# Patient Record
Sex: Male | Born: 1955 | Race: Black or African American | Hispanic: No | Smoking: Never smoker
Health system: Southern US, Community
[De-identification: ages and names within clinical notes are randomized; demographics above are authoritative.]

## PROBLEM LIST (undated history)

## (undated) DIAGNOSIS — E119 Type 2 diabetes mellitus without complications: Secondary | ICD-10-CM

## (undated) DIAGNOSIS — M1990 Unspecified osteoarthritis, unspecified site: Secondary | ICD-10-CM

## (undated) DIAGNOSIS — G4730 Sleep apnea, unspecified: Secondary | ICD-10-CM

## (undated) HISTORY — PX: VASECTOMY: SHX75

---

## 2017-11-30 ENCOUNTER — Ambulatory Visit (INDEPENDENT_AMBULATORY_CARE_PROVIDER_SITE_OTHER): Payer: Self-pay | Admitting: Orthopaedic Surgery

## 2017-12-08 ENCOUNTER — Ambulatory Visit (INDEPENDENT_AMBULATORY_CARE_PROVIDER_SITE_OTHER): Payer: Medicaid Other | Admitting: Orthopaedic Surgery

## 2017-12-08 ENCOUNTER — Other Ambulatory Visit (INDEPENDENT_AMBULATORY_CARE_PROVIDER_SITE_OTHER): Payer: Self-pay

## 2017-12-08 ENCOUNTER — Ambulatory Visit (INDEPENDENT_AMBULATORY_CARE_PROVIDER_SITE_OTHER): Payer: Self-pay

## 2017-12-08 ENCOUNTER — Encounter (INDEPENDENT_AMBULATORY_CARE_PROVIDER_SITE_OTHER): Payer: Self-pay | Admitting: Orthopaedic Surgery

## 2017-12-08 DIAGNOSIS — M2550 Pain in unspecified joint: Secondary | ICD-10-CM

## 2017-12-08 DIAGNOSIS — M1611 Unilateral primary osteoarthritis, right hip: Secondary | ICD-10-CM

## 2017-12-08 DIAGNOSIS — M25551 Pain in right hip: Secondary | ICD-10-CM

## 2017-12-08 NOTE — Addendum Note (Signed)
Addended by: Shonna ChockMCCLINTOCK, Cameran Pettey M on: 12/08/2017 04:08 PM   Modules accepted: Orders

## 2017-12-08 NOTE — Progress Notes (Signed)
Office Visit Note   Patient: Bradley Evans           Date of Birth: 08-28-1956           MRN: [id number] Visit Date: 12/08/2017              Requested by: No referring provider defined for this encounter. PCP: System, Pcp Not In   Assessment & Plan: Visit Diagnoses:  1. Pain in right hip   2. Unilateral primary osteoarthritis, right hip     Plan: We went over his x-rays with him in detail as well as talked about his clinical exam.  We had a long and thorough discussion about hip replacement surgery.  I do feel that this is something we could provide for him however we do need to check some labs today including a CBC, metabolic profile and hemoglobin A1c.  He understands that it is important for good blood glucose control to decrease the risk of infection.  He also understands that his obesity certainly placed in the difficult nature of this procedure and there is at heightened risk of wound complications in the obese population.  He also has a high risk of acute blood loss anemia as well as fracture, infection, DVT, dislocation and wearing out the components over time.  All questions and concerns were asked and addressed.  I gave him a handout about hip replacement surgery and would like to see him back in 2 weeks so we can go over his labs and answer any further questions before considering setting up for the surgery.  Follow-Up Instructions: Return in about 2 weeks (around 12/22/2017).   Orders:  Orders Placed This Encounter  Procedures  . XR HIP UNILAT W OR W/O PELVIS 1V RIGHT   No orders of the defined types were placed in this encounter.     Procedures: No procedures performed   Clinical Data: No additional findings.   Subjective: Chief Complaint  Patient presents with  . Right Hip - Pain  The patient is a very pleasant 62 year old gentleman who comes for further relation and treatment of right hip pain.  His right hip is been worsening over the years in terms of pain.   It hurts in the groin area.  It hurts with rotation activities.  Is been sitting for long period time and gets up to walk he has a lot of pain in the groin.  It does not wake him up at night yet.  He is someone who is significantly obese and is a diabetic.  He is from [facility].  He does go to community clinic down there.  He does not know what his hemoglobin A1c is.  He does take metformin and some other medication but he does not remember the name of today for diabetes.  His pain at this point is starting to detrimentally affect is active daily living, quality of life, mobility.  It can be 10 out of 10 at times.  He does report weighing approximately 270 pounds.  HPI  Review of Systems He denies any headache, chest pain, shortness of breath, fever, chills, nausea, vomiting.  Objective: Vital Signs: There were no vitals taken for this visit.  Physical Exam He is alert and oriented x3 and in no acute distress Ortho Exam Is a very pleasant significantly obese individual who gets up on the exam table easily.  I can easily rotate his left hip throughout full internal and external rotation with no pain in  the groin at all.  His right hip though is quite stiff with internal extra rotation and is very painful at the extremes of rotation.  He does have some slight knee pain but full range of motion of his knees.  Most the pain is around the right hip in the groin area.  He has no trochanteric pain on exam.  His leg lengths are equal. Specialty Comments:  No specialty comments available.  Imaging: Xr Hip Unilat W Or W/o Pelvis 1v Right  Result Date: 12/08/2017 An AP pelvis and a lateral of the right hip shows significant arthritis of the right hip joint.  There is periarticular osteophytes on the superior lateral femoral neck and head as well as posterior.  There is joint space narrowing and sclerotic changes.    PMFS History: There are no active problems to display for this  patient.  History reviewed. No pertinent past medical history.  History reviewed. No pertinent family history.  History reviewed. No pertinent surgical history. Social History   Occupational History  . Not on file  Tobacco Use  . Smoking status: Not on file  Substance and Sexual Activity  . Alcohol use: Not on file  . Drug use: Not on file  . Sexual activity: Not on file

## 2017-12-09 LAB — CBC WITH DIFFERENTIAL/PLATELET
BASOS ABS: 29 {cells}/uL (ref 0–200)
Basophils Relative: 0.8 %
Eosinophils Absolute: 40 cells/uL (ref 15–500)
Eosinophils Relative: 1.1 %
HCT: 39.3 % (ref 38.5–50.0)
HEMOGLOBIN: 13.1 g/dL — AB (ref 13.2–17.1)
Lymphs Abs: 1210 cells/uL (ref 850–3900)
MCH: 28.9 pg (ref 27.0–33.0)
MCHC: 33.3 g/dL (ref 32.0–36.0)
MCV: 86.6 fL (ref 80.0–100.0)
MONOS PCT: 13.4 %
MPV: 10.1 fL (ref 7.5–12.5)
NEUTROS ABS: 1840 {cells}/uL (ref 1500–7800)
NEUTROS PCT: 51.1 %
Platelets: 246 10*3/uL (ref 140–400)
RBC: 4.54 10*6/uL (ref 4.20–5.80)
RDW: 12.6 % (ref 11.0–15.0)
Total Lymphocyte: 33.6 %
WBC mixed population: 482 cells/uL (ref 200–950)
WBC: 3.6 10*3/uL — ABNORMAL LOW (ref 3.8–10.8)

## 2017-12-09 LAB — BASIC METABOLIC PANEL
BUN: 16 mg/dL (ref 7–25)
CALCIUM: 9.1 mg/dL (ref 8.6–10.3)
CO2: 22 mmol/L (ref 20–32)
Chloride: 105 mmol/L (ref 98–110)
Creat: 0.95 mg/dL (ref 0.70–1.25)
GLUCOSE: 269 mg/dL — AB (ref 65–99)
POTASSIUM: 4.2 mmol/L (ref 3.5–5.3)
SODIUM: 139 mmol/L (ref 135–146)

## 2017-12-09 LAB — HEMOGLOBIN A1C
EAG (MMOL/L): 13.3 (calc)
Hgb A1c MFr Bld: 10 % of total Hgb — ABNORMAL HIGH (ref <5.7)
Mean Plasma Glucose: 240 (calc)

## 2017-12-22 ENCOUNTER — Ambulatory Visit (INDEPENDENT_AMBULATORY_CARE_PROVIDER_SITE_OTHER): Payer: Self-pay | Admitting: Orthopaedic Surgery

## 2018-07-18 ENCOUNTER — Encounter (INDEPENDENT_AMBULATORY_CARE_PROVIDER_SITE_OTHER): Payer: Self-pay | Admitting: Orthopaedic Surgery

## 2018-07-18 ENCOUNTER — Ambulatory Visit (INDEPENDENT_AMBULATORY_CARE_PROVIDER_SITE_OTHER): Payer: Self-pay | Admitting: Orthopaedic Surgery

## 2018-07-18 VITALS — Ht 69.0 in | Wt 290.0 lb

## 2018-07-18 DIAGNOSIS — M25551 Pain in right hip: Secondary | ICD-10-CM

## 2018-07-18 DIAGNOSIS — M1611 Unilateral primary osteoarthritis, right hip: Secondary | ICD-10-CM

## 2018-07-18 NOTE — Progress Notes (Signed)
The patient is someone I seen before.  This was in January of this year.  He is 62 year old gentleman with severe arthritis in his right hip.  Unfortunately he had a hemoglobin A1c of 10.  He reported poor blood glucose control.  He also weighed 270 pounds at the time.  He still having significant right hip pain.  He is seen at the [facility] in [place].  He is unsure what his daily blood sugar is.  He is on new blood glucose medications but has not had anything checked in a while.  On exam we have checked his weight and he is gained 20 pounds.  He weighs 290.  Notes a BMI of 43.  He does have severe pain in the right hip with internal and external rotation and does radiate to the knee.  At this point a metal loss of what else I can provide for him until he gets his health under better control.  He understands he needs to have a hemoglobin A1c of 8 or below as well as a BMI of 40.  He would also need to get involved with the [facility] discount program as well.  I gave him information and he will get back to [place] once he knows the results of labs as well as getting his health under better control.  I did give him a one-time prescription for tramadol.

## 2018-12-28 ENCOUNTER — Ambulatory Visit (INDEPENDENT_AMBULATORY_CARE_PROVIDER_SITE_OTHER): Payer: Self-pay | Admitting: Orthopaedic Surgery

## 2018-12-28 DIAGNOSIS — M1611 Unilateral primary osteoarthritis, right hip: Secondary | ICD-10-CM

## 2018-12-28 DIAGNOSIS — M25561 Pain in right knee: Secondary | ICD-10-CM

## 2018-12-28 DIAGNOSIS — G8929 Other chronic pain: Secondary | ICD-10-CM

## 2018-12-28 NOTE — Progress Notes (Signed)
Office Visit Note   Patient: Bradley Evans           Date of Birth: 01-Jun-1956           MRN: [id number] Visit Date: 12/28/2018              Requested by: No referring provider defined for this encounter. PCP: System, Pcp Not In   Assessment & Plan: Visit Diagnoses:  1. Unilateral primary osteoarthritis, right hip   2. Chronic pain of right knee     Plan: He is definitely heading in the right direction and I feel that he will be able to have a successful hip replacement later this year.  He has a follow-up appointment in 3 months with the [facility] and I do feel that we should see him back in 3 months as well.  He will continue to work on his weight loss and blood glucose control.  At his visit with [place] in 3 months hopefully we will be setting him up for a right hip replacement surgery.  At his next visit I would like a standing low AP pelvis and lateral of his right hip as well as an AP and lateral of his right knee.  All question concerns were answered and addressed.  I have asked that he try to use a cane in his opposite hand offload his hip as well.  Follow-Up Instructions: Return in about 3 months (around 03/28/2019).   Orders:  No orders of the defined types were placed in this encounter.  No orders of the defined types were placed in this encounter.     Procedures: No procedures performed   Clinical Data: No additional findings.   Subjective: Chief Complaint  Patient presents with  . Right Hip - Pain  The patient is well-known to me.  He is a 63 year old with debilitating arthritis involving his right hip.  He is also diabetic and has had poor blood glucose control but he is worked hard on getting it under better control.  He is also someone who is morbidly obese and is worked on weight loss.  He is a patient of the [facility].  He says recently he has lost 17 pounds.  When he was weighed yesterday he again for those  back.  When I saw him in the fall of this past year his hemoglobin A1c was 10.  He said yesterday it was down to 8.5.  He still having severe right hip and groin pain and right knee pain.  HPI  Review of Systems He currently denies any headache, chest pain, shortness of breath, fever, chills, nausea, vomiting  Objective: Vital Signs: There were no vitals taken for this visit.  Physical Exam He is alert and orient x3 and in no acute distress Ortho Exam Examination of his right hip does show limitations with internal and external rotation secondary to pain and actually stiffness as well. Specialty Comments:  No specialty comments available.  Imaging: No results found.   PMFS History: Patient Active Problem List   Diagnosis Date Noted  . Unilateral primary osteoarthritis, right hip 07/18/2018  . Pain in right hip 07/18/2018   No past medical history on file.  No family history on file.  No past surgical history on file. Social History   Occupational History  . Not on file  Tobacco Use  . Smoking status: Never Smoker  . Smokeless tobacco: Never Used  Substance and Sexual Activity  .  Alcohol use: Not on file  . Drug use: Not on file  . Sexual activity: Not on file

## 2019-03-28 ENCOUNTER — Ambulatory Visit: Payer: Self-pay | Admitting: Orthopaedic Surgery

## 2020-07-10 ENCOUNTER — Ambulatory Visit: Payer: Medicaid Other | Admitting: Orthopaedic Surgery

## 2020-07-10 ENCOUNTER — Ambulatory Visit (INDEPENDENT_AMBULATORY_CARE_PROVIDER_SITE_OTHER): Payer: Self-pay

## 2020-07-10 ENCOUNTER — Other Ambulatory Visit: Payer: Self-pay

## 2020-07-10 DIAGNOSIS — M25551 Pain in right hip: Secondary | ICD-10-CM

## 2020-07-10 DIAGNOSIS — M1611 Unilateral primary osteoarthritis, right hip: Secondary | ICD-10-CM

## 2020-07-10 NOTE — Progress Notes (Signed)
Office Visit Note   Patient: Bradley Evans           Date of Birth: 09/01/1956           MRN: [id number] Visit Date: 07/10/2020              Requested by: No referring provider defined for this encounter. PCP: System, Pcp Not In   Assessment & Plan: Visit Diagnoses:  1. Pain in right hip   2. Unilateral primary osteoarthritis, right hip     Plan: He does have quite significant arthritis in his right hip and I am recommending a hip replacement.  However, his hemoglobin A1c is high and he would like to still work on weight loss as well as diabetic control and I agree with this.  He says he is a repeat visit in 90 days with his primary care physician to look at his diabetic control.  He is going work on activity modification, weight loss and a good dietary plan.  I gave him a handout about hip replacement surgery went over in detail what the surgery involves.  I would like to see him back in 3 months and at that time have a weight and BMI calculation as well as see if his hemoglobin A1c is at the level that we can safely schedule him for surgery.  All questions and concerns were answered and addressed.  He agrees with this treatment plan.  Follow-Up Instructions: Return in about 3 months (around 10/09/2020).   Orders:  Orders Placed This Encounter  Procedures  . XR HIP UNILAT W OR W/O PELVIS 1V RIGHT   No orders of the defined types were placed in this encounter.     Procedures: No procedures performed   Clinical Data: No additional findings.   Subjective: Chief Complaint  Patient presents with  . Right Hip - Pain  The patient is a very pleasant 64 year old gentleman who comes in for evaluation treatment of right hip pain and for me to consider him for knee replacement surgery.  He has pain in his right hip and groin that is radiating to his knee.  This is been going on for several years now and has been slowly getting worse.  At this point his right hip pain is daily and is  detrimentally affecting his mobility, his quality of life and his activities day living.  He is someone who is obese but has lost 20 pounds over the last several months.  He is also diabetic and reports his hemoglobin A1c just recently was 7.8.  He does have some left hip and groin pain as well.  He walks without an assistive device.  He said he is interested in still working on diabetic control and weight loss. HPI  Review of Systems He currently denies any headache, chest pain, shortness of breath, fever, chills, nausea, vomiting  Objective: Vital Signs: There were no vitals taken for this visit.  Physical Exam He is alert and orient x3 and in no acute distress; he does walk with a limp. Ortho Exam Examination of both hips show significant stiffness with both hips with the right more stiff than the left.  Both hips have pain with internal and external rotation with pain in the groin.  The right hip is much worse than the left.  His leg lengths are equal and his knee exam bilaterally is normal. Specialty Comments:  No specialty comments available.  Imaging: XR HIP UNILAT W OR W/O PELVIS 1V RIGHT  Result Date: 07/10/2020 An AP pelvis lateral the right hip shows significant arthritis of the right hip.  There is some flattening of the superior lateral femoral head as well as large periarticular osteophytes.    PMFS History: Patient Active Problem List   Diagnosis Date Noted  . Unilateral primary osteoarthritis, right hip 07/18/2018  . Pain in right hip 07/18/2018   No past medical history on file.  No family history on file.  No past surgical history on file. Social History   Occupational History  . Not on file  Tobacco Use  . Smoking status: Never Smoker  . Smokeless tobacco: Never Used  Substance and Sexual Activity  . Alcohol use: Not on file  . Drug use: Not on file  . Sexual activity: Not on file

## 2020-10-09 ENCOUNTER — Encounter: Payer: Self-pay | Admitting: Orthopaedic Surgery

## 2020-10-09 ENCOUNTER — Ambulatory Visit (INDEPENDENT_AMBULATORY_CARE_PROVIDER_SITE_OTHER): Payer: Self-pay | Admitting: Orthopaedic Surgery

## 2020-10-09 VITALS — Ht 69.0 in | Wt 259.0 lb

## 2020-10-09 DIAGNOSIS — M1611 Unilateral primary osteoarthritis, right hip: Secondary | ICD-10-CM

## 2020-10-09 DIAGNOSIS — M25551 Pain in right hip: Secondary | ICD-10-CM

## 2020-10-09 NOTE — Progress Notes (Signed)
The patient is well-known to me.  I been seeing him since early 2019 for his right hip.  He has severe arthritis in that right hip.  He has worked hard over the last few years to lose weight.  His BMI is now down to 38.  He is also diabetic and is now under good blood glucose control with his last hemoglobin A1c a few weeks ago of 6.5.  At this point his right hip pain is definitely affecting his mobility, his quality of life and his actives daily living.  He has well-documented severe arthritis in that right hip.  He has had no other acute change in medical status.  He is interested in proceeding with hip replacement surgery at this point.  We have worked on all the conservative nonoperative treatment measures for well over 2 years now including hip strengthening exercises as well as taking anti-inflammatories and intra-articular injections.  We held off on injections for a long time because of his blood glucose pain poorly controlled.  Now with it being under excellent control and with him continue to lose weight, he is the appropriate candidate this point for hip replacement.  I have already shown him a hip model and explained in detail what the surgery involves.  We have had a long thorough discussion about the risk and benefits of surgery and what to expect with his interoperative and postoperative course.  Examination of his left hip shows it moves smoothly and fluidly.  Examination of his right painful hip has significant stiffness with internal and external rotation and significant pain in the groin.  Previous x-rays were reviewed and it does show severe arthritis of the right hip with joint space narrowing in particular osteophytes.  At this point we will work on getting him scheduled for hip replacement surgery sometime after November 27, 2020.  All questions concerns were answered and addressed.  We will be in touch.

## 2020-12-02 ENCOUNTER — Other Ambulatory Visit: Payer: Self-pay | Admitting: Physician Assistant

## 2020-12-05 NOTE — Progress Notes (Signed)
No Pharmacies Listed     Your procedure is scheduled on February 1  Report to [facility] Main Entrance "A" at 1000 A.M., and check in at the Admitting office.  Call this number if you have problems the morning of surgery:  [phone]  Call [phone] if you have any questions prior to your surgery date Monday-Friday 8am-4pm    Remember:  Do not eat after midnight the night before your surgery  You may drink clear liquids until 0900 am the morning of your surgery.   Clear liquids allowed are: Water, Non-Citrus Juices (without pulp), Carbonated Beverages, Clear Tea, Black Coffee Only, and Gatorade   Enhanced Recovery after Surgery for Orthopedics Enhanced Recovery after Surgery is a protocol used to improve the stress on your body and your recovery after surgery.  Patient Instructions  . The night before surgery:  o No food after midnight. ONLY clear liquids after midnight  . The day of surgery (if you have diabetes):  o Drink ONE (1) small bottle of water by _0900 am____ the morning of surgery o This drink was given to you during your hospital  pre-op appointment visit.  o Nothing else to drink after completing the  Small bottle of water.         If you have questions, please contact your surgeon's office.     Take these medicines the morning of surgery with A SIP OF WATER  atorvastatin (LIPITOR)    As of today, STOP taking any Aspirin (unless otherwise instructed by your surgeon) Aleve, Naproxen, Ibuprofen, Motrin, Advil, Goody's, BC's, all herbal medications, fish oil, and all vitamins.   WHAT DO I DO ABOUT MY DIABETES MEDICATION?  Do not take dapagliflozin propanediol (FARXIGA the day before surgery   . Do not take oral diabetes medicines (pills) the morning of surgery. metFORMIN (GLUCOPHAGE)  And dapagliflozin propanediol (FARXIGA   HOW TO MANAGE YOUR DIABETES BEFORE AND AFTER SURGERY  Why is it important to control my blood sugar before and after  surgery? . Improving blood sugar levels before and after surgery helps healing and can limit problems. . A way of improving blood sugar control is eating a healthy diet by: o  Eating less sugar and carbohydrates o  Increasing activity/exercise o  Talking with your doctor about reaching your blood sugar goals . High blood sugars (greater than 180 mg/dL) can raise your risk of infections and slow your recovery, so you will need to focus on controlling your diabetes during the weeks before surgery. . Make sure that the doctor who takes care of your diabetes knows about your planned surgery including the date and location.  How do I manage my blood sugar before surgery? . Check your blood sugar at least 4 times a day, starting 2 days before surgery, to make sure that the level is not too high or low. . Check your blood sugar the morning of your surgery when you wake up and every 2 hours until you get to the Short Stay unit. o If your blood sugar is less than 70 mg/dL, you will need to treat for low blood sugar: - Do not take insulin. - Treat a low blood sugar (less than 70 mg/dL) with  cup of clear juice (cranberry or apple), 4 glucose tablets, OR glucose gel. - Recheck blood sugar in 15 minutes after treatment (to make sure it is greater than 70 mg/dL). If your blood sugar is not greater than 70 mg/dL on recheck, call [phone] for  further instructions. . Report your blood sugar to the short stay nurse when you get to Short Stay.  . If you are admitted to the hospital after surgery: o Your blood sugar will be checked by the staff and you will probably be given insulin after surgery (instead of oral diabetes medicines) to make sure you have good blood sugar levels. o The goal for blood sugar control after surgery is 80-180 mg/dL.                      Do not wear jewelry            Do not wear lotions, powders, colognes, or deodorant.            Men may shave face and neck.            Do not  bring valuables to the hospital.            [facility] is not responsible for any belongings or valuables.  Do NOT Smoke (Tobacco/Vaping) or drink Alcohol 24 hours prior to your procedure If you use a CPAP at night, you may bring all equipment for your overnight stay.   Contacts, glasses, dentures or bridgework may not be worn into surgery.      For patients admitted to the hospital, discharge time will be determined by your treatment team.   Patients discharged the day of surgery will not be allowed to drive home, and someone needs to stay with them for 24 hours.    Special instructions:   - Preparing For Surgery  Before surgery, you can play an important role. Because skin is not sterile, your skin needs to be as free of germs as possible. You can reduce the number of germs on your skin by washing with CHG (chlorahexidine gluconate) Soap before surgery.  CHG is an antiseptic cleaner which kills germs and bonds with the skin to continue killing germs even after washing.    Oral Hygiene is also important to reduce your risk of infection.  Remember - BRUSH YOUR TEETH THE MORNING OF SURGERY WITH YOUR REGULAR TOOTHPASTE  Please do not use if you have an allergy to CHG or antibacterial soaps. If your skin becomes reddened/irritated stop using the CHG.  Do not shave (including legs and underarms) for at least 48 hours prior to first CHG shower. It is OK to shave your face.  Please follow these instructions carefully.   1. Shower the NIGHT BEFORE SURGERY and the MORNING OF SURGERY with CHG Soap.   2. If you chose to wash your hair, wash your hair first as usual with your normal shampoo.  3. After you shampoo, rinse your hair and body thoroughly to remove the shampoo.  4. Use CHG as you would any other liquid soap. You can apply CHG directly to the skin and wash gently with a scrungie or a clean washcloth.   5. Apply the CHG Soap to your body ONLY FROM THE NECK DOWN.  Do not  use on open wounds or open sores. Avoid contact with your eyes, ears, mouth and genitals (private parts). Wash Face and genitals (private parts)  with your normal soap.   6. Wash thoroughly, paying special attention to the area where your surgery will be performed.  7. Thoroughly rinse your body with warm water from the neck down.  8. DO NOT shower/wash with your normal soap after using and rinsing off the CHG Soap.  9.  Pat yourself dry with a CLEAN TOWEL.  10. Wear CLEAN PAJAMAS to bed the night before surgery  11. Place CLEAN SHEETS on your bed the night of your first shower and DO NOT SLEEP WITH PETS.   Day of Surgery: Wear Clean/Comfortable clothing the morning of surgery Do not apply any deodorants/lotions.   Remember to brush your teeth WITH YOUR REGULAR TOOTHPASTE.   Please read over the following fact sheets that you were given.

## 2020-12-06 ENCOUNTER — Encounter (HOSPITAL_COMMUNITY)
Admission: RE | Admit: 2020-12-06 | Discharge: 2020-12-06 | Disposition: A | Discharge status: Home / Self Care | Payer: Medicaid Other | Source: Ambulatory Visit | Attending: Orthopaedic Surgery | Admitting: Orthopaedic Surgery

## 2020-12-06 ENCOUNTER — Other Ambulatory Visit: Payer: Self-pay

## 2020-12-06 ENCOUNTER — Telehealth: Payer: Self-pay | Admitting: Orthopaedic Surgery

## 2020-12-06 ENCOUNTER — Other Ambulatory Visit: Payer: Self-pay | Admitting: Orthopaedic Surgery

## 2020-12-06 ENCOUNTER — Other Ambulatory Visit (HOSPITAL_COMMUNITY)
Admission: RE | Admit: 2020-12-06 | Discharge: 2020-12-06 | Disposition: A | Discharge status: Home / Self Care | Payer: Medicaid Other | Source: Ambulatory Visit | Attending: Orthopaedic Surgery | Admitting: Orthopaedic Surgery

## 2020-12-06 ENCOUNTER — Encounter (HOSPITAL_COMMUNITY): Payer: Self-pay

## 2020-12-06 DIAGNOSIS — I4519 Other right bundle-branch block: Secondary | ICD-10-CM | POA: Insufficient documentation

## 2020-12-06 DIAGNOSIS — Z01818 Encounter for other preprocedural examination: Secondary | ICD-10-CM | POA: Insufficient documentation

## 2020-12-06 DIAGNOSIS — Z20822 Contact with and (suspected) exposure to covid-19: Secondary | ICD-10-CM | POA: Insufficient documentation

## 2020-12-06 DIAGNOSIS — Z01812 Encounter for preprocedural laboratory examination: Secondary | ICD-10-CM | POA: Insufficient documentation

## 2020-12-06 HISTORY — DX: Type 2 diabetes mellitus without complications: E11.9

## 2020-12-06 HISTORY — DX: Sleep apnea, unspecified: G47.30

## 2020-12-06 HISTORY — DX: Unspecified osteoarthritis, unspecified site: M19.90

## 2020-12-06 LAB — CBC
HCT: 45.1 % (ref 39.0–52.0)
Hemoglobin: 14.8 g/dL (ref 13.0–17.0)
MCH: 29.1 pg (ref 26.0–34.0)
MCHC: 32.8 g/dL (ref 30.0–36.0)
MCV: 88.6 fL (ref 80.0–100.0)
Platelets: 276 10*3/uL (ref 150–400)
RBC: 5.09 MIL/uL (ref 4.22–5.81)
RDW: 13.9 % (ref 11.5–15.5)
WBC: 3.9 10*3/uL — ABNORMAL LOW (ref 4.0–10.5)
nRBC: 0 % (ref 0.0–0.2)

## 2020-12-06 LAB — BASIC METABOLIC PANEL
Anion gap: 8 (ref 5–15)
BUN: 13 mg/dL (ref 8–23)
CO2: 25 mmol/L (ref 22–32)
Calcium: 9.2 mg/dL (ref 8.9–10.3)
Chloride: 107 mmol/L (ref 98–111)
Creatinine, Ser: 1.06 mg/dL (ref 0.61–1.24)
GFR, Estimated: >60 mL/min (ref >60)
Glucose, Bld: 108 mg/dL — ABNORMAL HIGH (ref 70–99)
Potassium: 4.3 mmol/L (ref 3.5–5.1)
Sodium: 140 mmol/L (ref 135–145)

## 2020-12-06 LAB — SURGICAL PCR SCREEN
MRSA, PCR: NEGATIVE
Staphylococcus aureus: NEGATIVE

## 2020-12-06 LAB — TYPE AND SCREEN
ABO/RH(D): O POS
Antibody Screen: NEGATIVE

## 2020-12-06 LAB — HEMOGLOBIN A1C
Hgb A1c MFr Bld: 6.7 % — ABNORMAL HIGH (ref 4.8–5.6)
Mean Plasma Glucose: 145.59 mg/dL

## 2020-12-06 LAB — GLUCOSE, CAPILLARY: Glucose-Capillary: 111 mg/dL — ABNORMAL HIGH (ref 70–99)

## 2020-12-06 NOTE — Telephone Encounter (Signed)
Tell him yes, we will.

## 2020-12-06 NOTE — Telephone Encounter (Signed)
This is his pharmacy. Per Leotis Shames he will need to take a hand written Rx to them. Unable to add it to the system

## 2020-12-06 NOTE — Telephone Encounter (Signed)
Pt advised. He wanted me to let you know the clinic he goes to closes @ 4pm

## 2020-12-06 NOTE — Telephone Encounter (Signed)
[facility] in [place] Address: [address] [place], [place] [address] Areas served: [facility] and nearby areas Hours:  Closed ? Oneita Jolly Phone: [phone]

## 2020-12-06 NOTE — Telephone Encounter (Signed)
Pt called wondering if you can prescribe him the pain medication the day before surgery for after the surgery because he gets it from a clinic and they'll be closed after he has surgery.

## 2020-12-06 NOTE — Progress Notes (Addendum)
PCP - [facility]y wil[facility] In [place] Cardiologist - na   Chest x-ray - na EKG - 12/06/20 Stress Test - na ECHO - na Cardiac Cath - na  Sleep Study - yes, study done > 5 yrs. ago CPAP - yes  Fasting Blood Sugar - 120-140 Checks Blood Sugar ___1__ times a week  Blood Thinner Instructions: na Aspirin Instructions: stop today  ERAS Protcol -yes Bottle water given to pt.  COVID TEST- 12/06/20   Anesthesia review:   Patient denies shortness of breath, fever, cough and chest pain at PAT appointment   All instructions explained to the patient, with a verbal understanding of the material. Patient agrees to go over the instructions while at home for a better understanding. Patient also instructed to self quarantine after being tested for COVID-19. The opportunity to ask questions was provided.

## 2020-12-06 NOTE — Telephone Encounter (Signed)
Do find out where he needs the medications sent to - which pharmacy is it?  I don't understand what he means about a clinic to send meds to

## 2020-12-07 LAB — SARS CORONAVIRUS 2 (TAT 6-24 HRS): SARS Coronavirus 2: NEGATIVE

## 2020-12-09 ENCOUNTER — Telehealth: Payer: Self-pay | Admitting: Orthopaedic Surgery

## 2020-12-09 MED ORDER — ASPIRIN 81 MG PO CHEW: 81.0000 mg | CHEWABLE_TABLET | Freq: Two times a day (BID) | ORAL | 0 refills | Status: AC

## 2020-12-09 MED ORDER — TIZANIDINE HCL 4 MG PO TABS: 4.0000 mg | ORAL_TABLET | Freq: Three times a day (TID) | ORAL | 1 refills | Status: AC | PRN

## 2020-12-09 MED ORDER — OXYCODONE HCL 5 MG PO TABS: 5.0000 mg | ORAL_TABLET | ORAL | 0 refills | Status: AC | PRN

## 2020-12-09 NOTE — Telephone Encounter (Signed)
His pharmacy that we sent his pain med too does not fill narcotics; he is asking if we can fill at [facility] [address] [place]

## 2020-12-09 NOTE — Telephone Encounter (Signed)
Pt called stating the pharmacy he uses doesn't fill controlled substances and he is scheduled for surgery on 12/10/20 so he would like a CB in regards to discuss how much the medicine may cost elsewhere and his other options as soon as possible.  [phone]

## 2020-12-10 ENCOUNTER — Ambulatory Visit (HOSPITAL_COMMUNITY): Payer: Self-pay | Admitting: Physician Assistant

## 2020-12-10 ENCOUNTER — Encounter (HOSPITAL_COMMUNITY): Payer: Self-pay | Admitting: Orthopaedic Surgery

## 2020-12-10 ENCOUNTER — Other Ambulatory Visit: Payer: Self-pay

## 2020-12-10 ENCOUNTER — Ambulatory Visit (HOSPITAL_COMMUNITY): Payer: Self-pay

## 2020-12-10 ENCOUNTER — Encounter (HOSPITAL_COMMUNITY)
Admission: RE | Disposition: A | Discharge status: Home / Self Care | Payer: Self-pay | Source: Home / Self Care | Attending: Orthopaedic Surgery

## 2020-12-10 ENCOUNTER — Inpatient Hospital Stay (HOSPITAL_COMMUNITY)
Admission: RE | Admit: 2020-12-10 | Discharge: 2020-12-13 | DRG: 470 | Disposition: A | Discharge status: Home / Self Care | Payer: Self-pay | Attending: Orthopaedic Surgery | Admitting: Orthopaedic Surgery

## 2020-12-10 ENCOUNTER — Ambulatory Visit (HOSPITAL_COMMUNITY): Payer: Self-pay | Admitting: Certified Registered Nurse Anesthetist

## 2020-12-10 DIAGNOSIS — E1165 Type 2 diabetes mellitus with hyperglycemia: Secondary | ICD-10-CM | POA: Diagnosis not present

## 2020-12-10 DIAGNOSIS — M1611 Unilateral primary osteoarthritis, right hip: Principal | ICD-10-CM

## 2020-12-10 DIAGNOSIS — M25751 Osteophyte, right hip: Secondary | ICD-10-CM | POA: Diagnosis present

## 2020-12-10 DIAGNOSIS — Z419 Encounter for procedure for purposes other than remedying health state, unspecified: Secondary | ICD-10-CM

## 2020-12-10 DIAGNOSIS — Z96641 Presence of right artificial hip joint: Secondary | ICD-10-CM | POA: Diagnosis present

## 2020-12-10 DIAGNOSIS — G4730 Sleep apnea, unspecified: Secondary | ICD-10-CM | POA: Diagnosis present

## 2020-12-10 HISTORY — PX: TOTAL HIP ARTHROPLASTY: SHX124

## 2020-12-10 LAB — GLUCOSE, CAPILLARY
Glucose-Capillary: 121 mg/dL — ABNORMAL HIGH (ref 70–99)
Glucose-Capillary: 90 mg/dL (ref 70–99)
Glucose-Capillary: 79 mg/dL (ref 70–99)
Glucose-Capillary: 136 mg/dL — ABNORMAL HIGH (ref 70–99)

## 2020-12-10 LAB — ABO/RH: ABO/RH(D): O POS

## 2020-12-10 SURGERY — ARTHROPLASTY, HIP, TOTAL, ANTERIOR APPROACH
Anesthesia: Monitor Anesthesia Care | Site: Hip | Laterality: Right

## 2020-12-10 MED ORDER — ONDANSETRON HCL 4 MG/2ML IJ SOLN: 4.0000 mg | Freq: Four times a day (QID) | INTRAMUSCULAR | Status: DC | PRN

## 2020-12-10 MED ORDER — METOCLOPRAMIDE HCL 5 MG/ML IJ SOLN
5.0000 mg | Freq: Three times a day (TID) | INTRAMUSCULAR | Status: DC | PRN
Start: 2020-12-10 — End: 2020-12-13

## 2020-12-10 MED ORDER — FENTANYL CITRATE (PF) 250 MCG/5ML IJ SOLN
INTRAMUSCULAR | Status: AC
  Filled 2020-12-10: qty 5

## 2020-12-10 MED ORDER — HYDROMORPHONE HCL 1 MG/ML IJ SOLN
0.5000 mg | INTRAMUSCULAR | Status: DC | PRN
  Administered 2020-12-10 – 2020-12-11 (×2): 1 mg via INTRAVENOUS
  Filled 2020-12-10 (×2): qty 1

## 2020-12-10 MED ORDER — LISINOPRIL 10 MG PO TABS
10.0000 mg | ORAL_TABLET | Freq: Every day | ORAL | Status: DC
Start: 2020-12-10 — End: 2020-12-13
  Administered 2020-12-10 – 2020-12-11 (×2): 10 mg via ORAL
  Filled 2020-12-10 (×2): qty 1

## 2020-12-10 MED ORDER — LACTATED RINGERS IV BOLUS: 500.0000 mL | Freq: Once | INTRAVENOUS | Status: DC

## 2020-12-10 MED ORDER — ALUM & MAG HYDROXIDE-SIMETH 200-200-20 MG/5ML PO SUSP: 30.0000 mL | ORAL | Status: DC | PRN

## 2020-12-10 MED ORDER — 0.9 % SODIUM CHLORIDE (POUR BTL) OPTIME
TOPICAL | Status: DC | PRN
  Administered 2020-12-10: 1000 mL

## 2020-12-10 MED ORDER — SODIUM CHLORIDE 0.9 % IV SOLN: INTRAVENOUS | Status: DC

## 2020-12-10 MED ORDER — BUPIVACAINE IN DEXTROSE 0.75-8.25 % IT SOLN
INTRATHECAL | Status: DC | PRN
  Administered 2020-12-10: 1.6 mL via INTRATHECAL

## 2020-12-10 MED ORDER — MENTHOL 3 MG MT LOZG
1.0000 | LOZENGE | OROMUCOSAL | Status: DC | PRN
Start: 2020-12-10 — End: 2020-12-13

## 2020-12-10 MED ORDER — DIPHENHYDRAMINE HCL 12.5 MG/5ML PO ELIX
12.5000 mg | ORAL_SOLUTION | ORAL | Status: DC | PRN
Start: 2020-12-10 — End: 2020-12-13
  Filled 2020-12-10: qty 10

## 2020-12-10 MED ORDER — PANTOPRAZOLE SODIUM 40 MG PO TBEC
40.0000 mg | DELAYED_RELEASE_TABLET | Freq: Every day | ORAL | Status: DC
  Administered 2020-12-10 – 2020-12-13 (×4): 40 mg via ORAL
  Filled 2020-12-10 (×4): qty 1

## 2020-12-10 MED ORDER — METOCLOPRAMIDE HCL 5 MG PO TABS: 5.0000 mg | ORAL_TABLET | Freq: Three times a day (TID) | ORAL | Status: DC | PRN

## 2020-12-10 MED ORDER — POVIDONE-IODINE 10 % EX SWAB
2.0000 "application " | Freq: Once | CUTANEOUS | Status: AC
  Administered 2020-12-10: 2 via TOPICAL

## 2020-12-10 MED ORDER — ONDANSETRON HCL 4 MG/2ML IJ SOLN
INTRAMUSCULAR | Status: DC | PRN
  Administered 2020-12-10: 4 mg via INTRAVENOUS

## 2020-12-10 MED ORDER — FENTANYL CITRATE (PF) 250 MCG/5ML IJ SOLN
INTRAMUSCULAR | Status: DC | PRN
  Administered 2020-12-10: 25 ug via INTRAVENOUS
  Administered 2020-12-10: 100 ug via INTRAVENOUS

## 2020-12-10 MED ORDER — LACTATED RINGERS IV BOLUS: 250.0000 mL | Freq: Once | INTRAVENOUS | Status: DC

## 2020-12-10 MED ORDER — DOCUSATE SODIUM 100 MG PO CAPS
100.0000 mg | ORAL_CAPSULE | Freq: Two times a day (BID) | ORAL | Status: DC
  Administered 2020-12-10 – 2020-12-13 (×6): 100 mg via ORAL
  Filled 2020-12-10 (×6): qty 1

## 2020-12-10 MED ORDER — BUPIVACAINE HCL (PF) 0.25 % IJ SOLN
INTRAMUSCULAR | Status: DC | PRN
  Administered 2020-12-10: 30 mL

## 2020-12-10 MED ORDER — ASPIRIN 81 MG PO CHEW
81.0000 mg | CHEWABLE_TABLET | Freq: Two times a day (BID) | ORAL | Status: DC
  Administered 2020-12-10 – 2020-12-13 (×6): 81 mg via ORAL
  Filled 2020-12-10 (×6): qty 1

## 2020-12-10 MED ORDER — BUPIVACAINE HCL (PF) 0.25 % IJ SOLN
INTRAMUSCULAR | Status: AC
  Filled 2020-12-10: qty 30

## 2020-12-10 MED ORDER — METHOCARBAMOL 1000 MG/10ML IJ SOLN
500.0000 mg | Freq: Four times a day (QID) | INTRAVENOUS | Status: DC | PRN
  Filled 2020-12-10: qty 5

## 2020-12-10 MED ORDER — OXYCODONE HCL 5 MG PO TABS
10.0000 mg | ORAL_TABLET | ORAL | Status: DC | PRN
  Administered 2020-12-11 (×2): 15 mg via ORAL
  Filled 2020-12-10 (×2): qty 3

## 2020-12-10 MED ORDER — OXYCODONE HCL 5 MG PO TABS
5.0000 mg | ORAL_TABLET | ORAL | Status: DC | PRN
  Administered 2020-12-10 – 2020-12-11 (×4): 10 mg via ORAL
  Administered 2020-12-12 (×2): 5 mg via ORAL
  Administered 2020-12-12: 10 mg via ORAL
  Administered 2020-12-12 – 2020-12-13 (×4): 5 mg via ORAL
  Filled 2020-12-10 (×2): qty 1
  Filled 2020-12-10 (×4): qty 2
  Filled 2020-12-10: qty 1
  Filled 2020-12-10 (×2): qty 2
  Filled 2020-12-10 (×2): qty 1

## 2020-12-10 MED ORDER — ONDANSETRON HCL 4 MG/2ML IJ SOLN: 4.0000 mg | Freq: Once | INTRAMUSCULAR | Status: DC | PRN

## 2020-12-10 MED ORDER — DAPAGLIFLOZIN PROPANEDIOL 10 MG PO TABS
10.0000 mg | ORAL_TABLET | Freq: Every day | ORAL | Status: DC
  Administered 2020-12-11 – 2020-12-13 (×3): 10 mg via ORAL
  Filled 2020-12-10 (×3): qty 1

## 2020-12-10 MED ORDER — TRANEXAMIC ACID-NACL 1000-0.7 MG/100ML-% IV SOLN
1000.0000 mg | INTRAVENOUS | Status: AC
  Administered 2020-12-10: 1000 mg via INTRAVENOUS
  Filled 2020-12-10: qty 100

## 2020-12-10 MED ORDER — METFORMIN HCL 500 MG PO TABS
1000.0000 mg | ORAL_TABLET | Freq: Two times a day (BID) | ORAL | Status: DC
  Administered 2020-12-11 – 2020-12-13 (×5): 1000 mg via ORAL
  Filled 2020-12-10 (×5): qty 2

## 2020-12-10 MED ORDER — PHENOL 1.4 % MT LIQD: 1.0000 | OROMUCOSAL | Status: DC | PRN

## 2020-12-10 MED ORDER — PROPOFOL 500 MG/50ML IV EMUL
INTRAVENOUS | Status: DC | PRN
  Administered 2020-12-10: 100 ug/kg/min via INTRAVENOUS

## 2020-12-10 MED ORDER — CEFAZOLIN SODIUM-DEXTROSE 2-4 GM/100ML-% IV SOLN
2.0000 g | INTRAVENOUS | Status: AC
  Administered 2020-12-10: 2 g via INTRAVENOUS
  Filled 2020-12-10: qty 100

## 2020-12-10 MED ORDER — ACETAMINOPHEN 325 MG PO TABS
325.0000 mg | ORAL_TABLET | Freq: Four times a day (QID) | ORAL | Status: DC | PRN
  Administered 2020-12-11 – 2020-12-13 (×4): 650 mg via ORAL
  Filled 2020-12-10 (×4): qty 2

## 2020-12-10 MED ORDER — METHOCARBAMOL 500 MG PO TABS
500.0000 mg | ORAL_TABLET | Freq: Four times a day (QID) | ORAL | Status: DC | PRN
  Administered 2020-12-10 – 2020-12-11 (×4): 500 mg via ORAL
  Filled 2020-12-10 (×4): qty 1

## 2020-12-10 MED ORDER — HYDROMORPHONE HCL 1 MG/ML IJ SOLN
INTRAMUSCULAR | Status: AC
  Filled 2020-12-10: qty 1

## 2020-12-10 MED ORDER — CHLORHEXIDINE GLUCONATE 0.12 % MT SOLN
15.0000 mL | Freq: Once | OROMUCOSAL | Status: AC
  Administered 2020-12-10: 15 mL via OROMUCOSAL
  Filled 2020-12-10: qty 15

## 2020-12-10 MED ORDER — SODIUM CHLORIDE 0.9 % IR SOLN
Status: DC | PRN
  Administered 2020-12-10: 3000 mL

## 2020-12-10 MED ORDER — ACETAMINOPHEN 10 MG/ML IV SOLN
INTRAVENOUS | Status: AC
  Filled 2020-12-10: qty 100

## 2020-12-10 MED ORDER — ONDANSETRON HCL 4 MG PO TABS: 4.0000 mg | ORAL_TABLET | Freq: Four times a day (QID) | ORAL | Status: DC | PRN

## 2020-12-10 MED ORDER — TAMSULOSIN HCL 0.4 MG PO CAPS
0.4000 mg | ORAL_CAPSULE | Freq: Every day | ORAL | Status: DC
  Administered 2020-12-10 – 2020-12-12 (×3): 0.4 mg via ORAL
  Filled 2020-12-10 (×3): qty 1

## 2020-12-10 MED ORDER — DAPAGLIFLOZIN PROPANEDIOL 10 MG PO TABS
10.0000 mg | ORAL_TABLET | Freq: Every day | ORAL | Status: DC
  Filled 2020-12-10: qty 1

## 2020-12-10 MED ORDER — LACTATED RINGERS IV SOLN: INTRAVENOUS | Status: DC

## 2020-12-10 MED ORDER — ACETAMINOPHEN 10 MG/ML IV SOLN
1000.0000 mg | Freq: Once | INTRAVENOUS | Status: DC | PRN
  Administered 2020-12-10: 1000 mg via INTRAVENOUS

## 2020-12-10 MED ORDER — ORAL CARE MOUTH RINSE: 15.0000 mL | Freq: Once | OROMUCOSAL | Status: AC

## 2020-12-10 MED ORDER — HYDROMORPHONE HCL 1 MG/ML IJ SOLN
0.2500 mg | INTRAMUSCULAR | Status: DC | PRN
  Administered 2020-12-10: 0.25 mg via INTRAVENOUS
  Administered 2020-12-10: 0.5 mg via INTRAVENOUS

## 2020-12-10 MED ORDER — ATORVASTATIN CALCIUM 40 MG PO TABS
40.0000 mg | ORAL_TABLET | Freq: Every day | ORAL | Status: DC
  Administered 2020-12-10 – 2020-12-13 (×4): 40 mg via ORAL
  Filled 2020-12-10 (×4): qty 1

## 2020-12-10 SURGICAL SUPPLY — 55 items
ACETAB CUP W/GRIPTION 54 (Plate) ×2 IMPLANT
BENZOIN TINCTURE PRP APPL 2/3 (GAUZE/BANDAGES/DRESSINGS) ×2 IMPLANT
BLADE CLIPPER SURG (BLADE) IMPLANT
BLADE SAW SGTL 18X1.27X75 (BLADE) ×2 IMPLANT
COLLAR OFFSET CORAIL SZ 10 HIP (Stem) ×1 IMPLANT
CORAIL OFFSET COLLAR SZ 10 HIP (Stem) ×2 IMPLANT
COVER SURGICAL LIGHT HANDLE (MISCELLANEOUS) ×2 IMPLANT
COVER WAND RF STERILE (DRAPES) ×2 IMPLANT
CUP ACETAB W/GRIPTION 54 (Plate) ×1 IMPLANT
DRAPE C-ARM 42X72 X-RAY (DRAPES) ×2 IMPLANT
DRAPE STERI IOBAN 125X83 (DRAPES) ×2 IMPLANT
DRAPE U-SHAPE 47X51 STRL (DRAPES) ×6 IMPLANT
DRSG AQUACEL AG ADV 3.5X10 (GAUZE/BANDAGES/DRESSINGS) ×2 IMPLANT
DURAPREP 26ML APPLICATOR (WOUND CARE) ×2 IMPLANT
ELECT BLADE 4.0 EZ CLEAN MEGAD (MISCELLANEOUS) ×2
ELECT BLADE 6.5 EXT (BLADE) IMPLANT
ELECT REM PT RETURN 9FT ADLT (ELECTROSURGICAL) ×2
ELECTRODE BLDE 4.0 EZ CLN MEGD (MISCELLANEOUS) ×1 IMPLANT
ELECTRODE REM PT RTRN 9FT ADLT (ELECTROSURGICAL) ×1 IMPLANT
FACESHIELD WRAPAROUND (MASK) ×4 IMPLANT
GLOVE ECLIPSE 8.0 STRL XLNG CF (GLOVE) ×2 IMPLANT
GLOVE ORTHO TXT STRL SZ7.5 (GLOVE) ×4 IMPLANT
GLOVE SRG 8 PF TXTR STRL LF DI (GLOVE) ×2 IMPLANT
GLOVE SURG UNDER POLY LF SZ8 (GLOVE) ×2
GOWN STRL REUS W/ TWL LRG LVL3 (GOWN DISPOSABLE) ×2 IMPLANT
GOWN STRL REUS W/ TWL XL LVL3 (GOWN DISPOSABLE) ×2 IMPLANT
GOWN STRL REUS W/TWL LRG LVL3 (GOWN DISPOSABLE) ×2
GOWN STRL REUS W/TWL XL LVL3 (GOWN DISPOSABLE) ×2
HANDPIECE INTERPULSE COAX TIP (DISPOSABLE) ×1
HEAD M SROM 36MM 2 (Hips) ×1 IMPLANT
KIT BASIN OR (CUSTOM PROCEDURE TRAY) ×2 IMPLANT
KIT TURNOVER KIT B (KITS) ×2 IMPLANT
LINER NEUTRAL 54X36MM PLUS 4 (Hips) ×2 IMPLANT
MANIFOLD NEPTUNE II (INSTRUMENTS) ×2 IMPLANT
NS IRRIG 1000ML POUR BTL (IV SOLUTION) ×2 IMPLANT
PACK TOTAL JOINT (CUSTOM PROCEDURE TRAY) ×2 IMPLANT
PAD ARMBOARD 7.5X6 YLW CONV (MISCELLANEOUS) ×2 IMPLANT
SET HNDPC FAN SPRY TIP SCT (DISPOSABLE) ×1 IMPLANT
SROM M HEAD 36MM 2 (Hips) ×2 IMPLANT
STAPLER VISISTAT 35W (STAPLE) IMPLANT
STRIP CLOSURE SKIN 1/2X4 (GAUZE/BANDAGES/DRESSINGS) ×4 IMPLANT
SUT ETHIBOND NAB CT1 #1 30IN (SUTURE) ×2 IMPLANT
SUT MNCRL AB 4-0 PS2 18 (SUTURE) IMPLANT
SUT VIC AB 0 CT1 27 (SUTURE) ×1
SUT VIC AB 0 CT1 27XBRD ANBCTR (SUTURE) ×1 IMPLANT
SUT VIC AB 1 CT1 27 (SUTURE) ×1
SUT VIC AB 1 CT1 27XBRD ANBCTR (SUTURE) ×1 IMPLANT
SUT VIC AB 2-0 CT1 27 (SUTURE) ×1
SUT VIC AB 2-0 CT1 TAPERPNT 27 (SUTURE) ×1 IMPLANT
TOWEL GREEN STERILE (TOWEL DISPOSABLE) ×2 IMPLANT
TOWEL GREEN STERILE FF (TOWEL DISPOSABLE) ×2 IMPLANT
TRAY CATH 16FR W/PLASTIC CATH (SET/KITS/TRAYS/PACK) IMPLANT
TRAY FOLEY W/BAG SLVR 16FR (SET/KITS/TRAYS/PACK)
TRAY FOLEY W/BAG SLVR 16FR ST (SET/KITS/TRAYS/PACK) IMPLANT
WATER STERILE IRR 1000ML POUR (IV SOLUTION) ×4 IMPLANT

## 2020-12-10 NOTE — Progress Notes (Signed)
PT Cancellation Note  Patient Details Name: Bradley Evans MRN: [id number] DOB: April 13, 1956   Cancelled Treatment:    Reason Eval/Treat Not Completed: Other (comment) Pt RN reports pt not ready to participate with therapy at this time secondary to block. Will follow up as schedule allows.   Cindee Salt, DPT  Acute Rehabilitation Services  Pager: [phone] Office: [phone]    Lehman Prom 12/10/2020, 3:29 PM

## 2020-12-10 NOTE — Brief Op Note (Signed)
12/10/2020  1:47 PM  PATIENT:  Bradley Evans  65 y.o. male  PRE-OPERATIVE DIAGNOSIS:  osteoarthritis right hip  POST-OPERATIVE DIAGNOSIS:  osteoarthritis right hip  PROCEDURE:  Procedure(s): RIGHT TOTAL HIP ARTHROPLASTY ANTERIOR APPROACH (Right)  SURGEON:  Surgeon(s) and Role:    Kathryne Hitch, MD - Primary  PHYSICIAN ASSISTANT:  Rexene Edison, PA-C  ANESTHESIA:   local and spinal  EBL:  250 mL   COUNTS:  YES  PLAN OF CARE: Discharge to home after PACU  PATIENT DISPOSITION:  PACU - hemodynamically stable.   Delay start of Pharmacological VTE agent (>24hrs) due to surgical blood loss or risk of bleeding: no

## 2020-12-10 NOTE — H&P (Signed)
TOTAL HIP ADMISSION H&P  Patient is admitted for right total hip arthroplasty.  Subjective:  Chief Complaint: right hip pain  HPI: Bradley Evans, 65 y.o. male, has a history of pain and functional disability in the right hip(s) due to arthritis and patient has failed non-surgical conservative treatments for greater than 12 weeks to include NSAID's and/or analgesics, corticosteriod injections, flexibility and strengthening excercises, use of assistive devices, weight reduction as appropriate and activity modification.  Onset of symptoms was gradual starting 3 years ago with gradually worsening course since that time.The patient noted no past surgery on the right hip(s).  Patient currently rates pain in the right hip at 10 out of 10 with activity. Patient has night pain, worsening of pain with activity and weight bearing, trendelenberg gait, pain that interfers with activities of daily living and pain with passive range of motion. Patient has evidence of subchondral sclerosis, periarticular osteophytes and joint space narrowing by imaging studies. This condition presents safety issues increasing the risk of falls.  There is no current active infection.  Patient Active Problem List   Diagnosis Date Noted  . Unilateral primary osteoarthritis, right hip 07/18/2018  . Pain in right hip 07/18/2018   Past Medical History:  Diagnosis Date  . Arthritis    knees  . Diabetes mellitus without complication (HCC)   . Sleep apnea     Past Surgical History:  Procedure Laterality Date  . VASECTOMY      Current Facility-Administered Medications  Medication Dose Route Frequency Provider Last Rate Last Admin  . ceFAZolin (ANCEF) IVPB 2g/100 mL premix  2 g Intravenous On Call to OR Kirtland Bouchard, PA-C      . lactated ringers infusion   Intravenous Continuous Kathryne Hitch, MD 10 mL/hr at 12/10/20 1010 New Bag at 12/10/20 1010  . tranexamic acid (CYKLOKAPRON) IVPB 1,000 mg  1,000 mg Intravenous To  OR Kirtland Bouchard, PA-C       No Known Allergies  Social History   Tobacco Use  . Smoking status: Never Smoker  . Smokeless tobacco: Never Used  Substance Use Topics  . Alcohol use: Never    History reviewed. No pertinent family history.   Review of Systems  Musculoskeletal: Positive for back pain, gait problem and joint swelling.  All other systems reviewed and are negative.   Objective:  Physical Exam Vitals reviewed.  Constitutional:      Appearance: Normal appearance.  HENT:     Head: Normocephalic and atraumatic.  Eyes:     Extraocular Movements: Extraocular movements intact.     Pupils: Pupils are equal, round, and reactive to light.  Cardiovascular:     Rate and Rhythm: Normal rate and regular rhythm.     Pulses: Normal pulses.  Pulmonary:     Effort: Pulmonary effort is normal.     Breath sounds: Normal breath sounds.  Abdominal:     Palpations: Abdomen is soft.  Musculoskeletal:     Cervical back: Normal range of motion.     Right hip: Tenderness and bony tenderness present. Decreased range of motion. Decreased strength.  Neurological:     Mental Status: He is alert and oriented to person, place, and time.  Psychiatric:        Behavior: Behavior normal.     Vital signs in last 24 hours: Temp:  [97.8 F (36.6 C)] 97.8 F (36.6 C) (02/01 0955) Pulse Rate:  [72] 72 (02/01 0955) Resp:  [19] 19 (02/01 0955) BP: (134)/(78) 134/78 (  02/01 0955) SpO2:  [100 %] 100 % (02/01 0955) Weight:  [117.5 kg] 117.5 kg (02/01 0955)  Labs:   Estimated body mass index is 38.25 kg/m as calculated from the following:   Height as of this encounter: 5\' 9"  (1.753 m).   Weight as of this encounter: 117.5 kg.   Imaging Review Plain radiographs demonstrate severe degenerative joint disease of the right hip(s). The bone quality appears to be good for age and reported activity level.      Assessment/Plan:  End stage arthritis, right hip(s)  The patient history,  physical examination, clinical judgement of the provider and imaging studies are consistent with end stage degenerative joint disease of the right hip(s) and total hip arthroplasty is deemed medically necessary. The treatment options including medical management, injection therapy, arthroscopy and arthroplasty were discussed at length. The risks and benefits of total hip arthroplasty were presented and reviewed. The risks due to aseptic loosening, infection, stiffness, dislocation/subluxation,  thromboembolic complications and other imponderables were discussed.  The patient acknowledged the explanation, agreed to proceed with the plan and consent was signed. Patient is being admitted for inpatient treatment for surgery, pain control, PT, OT, prophylactic antibiotics, VTE prophylaxis, progressive ambulation and ADL's and discharge planning.The patient is planning to be discharged home with home health services

## 2020-12-10 NOTE — Op Note (Signed)
NAME: Bradley Evans, Bradley Evans MEDICAL RECORD [id number] ACCOUNT [id number] DATE OF BIRTH:[birth year] FACILITY: [facility] LOCATION: [facility] PHYSICIAN:Nakaila Freeze Aretha Parrot, MD  OPERATIVE REPORT  DATE OF PROCEDURE:  12/10/2020  PREOPERATIVE DIAGNOSES:  Primary osteoarthritis and degenerative joint disease, right hip.  POSTOPERATIVE DIAGNOSES:  Primary osteoarthritis and degenerative joint disease, right hip.  PROCEDURE:  Right total hip arthroplasty through direct anterior approach.  IMPLANTS:  DePuy Sector Gription acetabular component size 54, size 36+4 neutral polyethylene liner, size 10 Corail femoral component with high offset, size 36-2 metal hip ball.  SURGEON:  Vanita Panda. Magnus Ivan, MD  ASSISTANT:  Richardean Canal, PA-C  ANESTHESIA: 1.  Spinal. 2.  Local with 0.25% plain Marcaine.  ANTIBIOTICS:  Two grams IV Ancef.  BLOOD LOSS:  250 mL.  COMPLICATIONS:  None.  INDICATIONS:  The patient is a 65 year old gentleman well known to me.  I have seen him for many years now due to debilitating arthritis involving his right hip.  We have had to delay surgery due to his morbid obesity and his poor diabetic control.  When  I first saw him, his BMI was well over 40 and his hemoglobin A1c was 10.  Over the last several years, he has worked on his lifestyle changes and better medical control.  His hemoglobin A1c is now down in the mid 6 region and his BMI is now below 40.   At this point, we do feel safe with proceeding with a total hip arthroplasty.  His hip pain is daily and it is detrimentally affecting his mobility, his quality of life and his activities of daily living.  His x-ray shows significant arthritis of his  right hip, and this has been well documented on clinical exam as well.  He understands with surgery, there is certainly heightened risk of acute blood loss anemia, nerve or vessel injury, fracture, infection, dislocation, DVT, implant failure and skin  and soft tissue issues, which  were all again heightened given his obesity and diabetes.  He understands our goals are to decrease pain, improve mobility and overall improve quality of life.  DESCRIPTION OF PROCEDURE:  After informed consent was obtained and appropriate right hip was marked, he was brought to the operating room and sat up on the stretcher where spinal anesthesia was obtained.  He was laid in supine position on the stretcher.   Traction boots were placed on both his feet.  Next, he was placed supine on the Hana fracture table, the perineal post in place and both legs in line skeletal traction device and no traction applied.  His right hip was prepped and draped with DuraPrep  and sterile drapes.  A timeout was called and he was identified as correct patient, correct right hip.  I then made an incision just inferior and posterior to the anterior superior iliac spine and carried this obliquely down the leg.  We dissected down  tensor fascia lata muscle.  Tensor fascia was then divided longitudinally to proceed with direct anterior approach to the hip.  We identified and cauterized circumflex vessels and identified the hip capsule, entered the hip capsule in an L-type format,  finding moderate joint effusion and significant periarticular osteophytes around the lateral femoral head and neck.  We placed Cobra retractors in the medial and lateral femoral neck and made our femoral neck cut with an oscillating saw just proximal to  the lesser trochanter.  We completed this with an osteotome and placed a corkscrew guide in the femoral head and removed the femoral  head in its entirety and found a wide area devoid of cartilage.  I then placed a bent Hohmann over the medial acetabular  rim and removed remnants of acetabular labrum and other debris and then began reaming under direct visualization from a size 44 reamer in stepwise increments going up to a size 53 with all reamers placed under direct visualization, the last reamer was   also placed under direct fluoroscopy, so we could obtain our depth of reaming, our inclination and anteversion.  I then placed the real DePuy Sector Gription acetabular component size 54 and a 36+4 neutral polyethylene liner.  Attention was then turned  to the femur.  With the leg externally rotated to 120 degrees, extended and adducted, we were able to place the Mueller retractor medially and Hohmann retractor behind the greater trochanter.  We released lateral joint capsule and used a box-cutting  osteotome to enter the femoral canal and a rongeur to lateralize.  I then began broaching using the Corail broaching system from a size 8 going up to a size 10.  With a size 10 in place, we trialed a standard offset femoral neck and a 36-2 hip ball and  reduced this in the acetabulum and it was stable, but we felt like we needed just a little bit more offset.  We dislocated the hip and removed the trial components.  We placed the real Corail femoral component size 10 with high offset.  We still went  with a 36-2 hip ball and again reduced this in the acetabulum and we did feel like we had increased his leg length.  His range of motion and stability were improved as well as his offset, assessed radiographically and mechanically.  We then irrigated the  soft tissue with normal saline solution using pulsatile lavage.  The tensor fascia was closed with #1 Vicryl followed by 0 Vicryl to close the deep tissue and 2-0 Vicryl for subcutaneous tissue.  The skin was closed with staples.  The incision was  infiltrated with 0.25% plain Marcaine and Aquacel dressing was applied.  An in and out catheterization was performed.  He was taken off the Hana table and taken to recovery room in stable condition with all final counts being correct.  No complications  noted.  Of note, Rexene Edison, PA-C, did assist during the entire case and his assistance was crucial for facilitating all aspects of this case.  HN/NUANCE  D:12/10/2020  T:12/10/2020 JOB:[id number]

## 2020-12-10 NOTE — Anesthesia Procedure Notes (Signed)
Spinal  Patient location during procedure: OR Start time: 12/10/2020 12:10 PM End time: 12/10/2020 12:14 PM Staffing Performed: anesthesiologist  Anesthesiologist: Atilano Median, DO Preanesthetic Checklist Completed: patient identified, IV checked, site marked, risks and benefits discussed, surgical consent, monitors and equipment checked, pre-op evaluation and timeout performed Spinal Block Patient position: sitting Prep: DuraPrep Patient monitoring: heart rate, cardiac monitor, continuous pulse ox and blood pressure Approach: left paramedian Location: L3-4 Injection technique: single-shot Needle Needle type: Pencan  Needle gauge: 24 G Needle length: 10 cm Additional Notes Patient identified. Risks/Benefits/Options discussed with patient including but not limited to bleeding, infection, nerve damage, paralysis, failed block, incomplete pain control, headache, blood pressure changes, nausea, vomiting, reactions to medications, itching and postpartum back pain. Confirmed with bedside nurse the patient's most recent platelet count. Confirmed with patient that they are not currently taking any anticoagulation, have any bleeding history or any family history of bleeding disorders. Patient expressed understanding and wished to proceed. All questions were answered. Sterile technique was used throughout the entire procedure. Please see nursing notes for vital signs. Warning signs of high block given to the patient including shortness of breath, tingling/numbness in hands, complete motor block, or any concerning symptoms with instructions to call for help. Patient was given instructions on fall risk and not to get out of bed. All questions and concerns addressed with instructions to call with any issues or inadequate analgesia.

## 2020-12-10 NOTE — Evaluation (Signed)
Physical Therapy Evaluation Patient Details Name: Bradley Evans MRN: [id number] DOB: November 18, 1955 Today's Date: 12/10/2020   History of Present Illness  Pt is a 65 y/o male s/p R THA, direct anterior. PMH includes DM, HTN, and sleep apnea on CPAP.  Clinical Impression  Pt s/p surgery above with deficits below. Pt requiring min A for bed mobility. Min to mod A required for stability to take steps using RW as pt reporting RLE instability. Feel pt is at increased risk for falls and would benefit from further acute PT prior to d/c home; discussed with RN. Reports sister and daughter will be able to assist as needed. Will continue to follow acutely.     Follow Up Recommendations Follow surgeon's recommendation for DC plan and follow-up therapies;Supervision for mobility/OOB    Equipment Recommendations  Rolling walker with 5" wheels;3in1 (PT)    Recommendations for Other Services       Precautions / Restrictions Precautions Precautions: Fall Restrictions Weight Bearing Restrictions: Yes RLE Weight Bearing: Weight bearing as tolerated      Mobility  Bed Mobility Overal bed mobility: Needs Assistance Bed Mobility: Supine to Sit;Sit to Supine     Supine to sit: Min assist Sit to supine: Min assist   General bed mobility comments: Min A For RLE assist and trunk assist. Increased time required.    Transfers Overall transfer level: Needs assistance Equipment used: Rolling walker (2 wheeled) Transfers: Sit to/from Stand Sit to Stand: Min assist         General transfer comment: Min A for lift assist and steadying. Pt reporting RLE felt weak.  Ambulation/Gait Ambulation/Gait assistance: Min assist;Mod assist Gait Distance (Feet): 1 Feet Assistive device: Rolling walker (2 wheeled) Gait Pattern/deviations: Narrow base of support;Step-to pattern;Decreased step length - right;Decreased step length - left;Decreased weight shift to left     General Gait Details: Pt with RLE  instability and very narrow BOS. LOB X1 requiring mod A for steadying. Unsafe to attempt further mobility.  Stairs            Wheelchair Mobility    Modified Rankin (Stroke Patients Only)       Balance Overall balance assessment: Needs assistance Sitting-balance support: No upper extremity supported;Feet supported Sitting balance-Leahy Scale: Good     Standing balance support: Bilateral upper extremity supported Standing balance-Leahy Scale: Poor Standing balance comment: Reliant on BUE support                             Pertinent Vitals/Pain Pain Assessment: Faces Faces Pain Scale: Hurts little more Pain Location: R hip Pain Descriptors / Indicators: Burning;Operative site guarding Pain Intervention(s): Monitored during session;Limited activity within patient's tolerance;Repositioned    Home Living Family/patient expects to be discharged to:: Private residence Living Arrangements: Alone Available Help at Discharge: Family;Available 24 hours/day Type of Home: House Home Access: Stairs to enter Entrance Stairs-Rails: Doctor, general practice of Steps: 4 Home Layout: One level Home Equipment: Shower seat      Prior Function Level of Independence: Independent               Hand Dominance        Extremity/Trunk Assessment   Upper Extremity Assessment Upper Extremity Assessment: Overall WFL for tasks assessed    Lower Extremity Assessment Lower Extremity Assessment: RLE deficits/detail RLE Deficits / Details: deficits consistent with post op pain and weakness.    Cervical / Trunk Assessment Cervical / Trunk Assessment: Normal  Communication   Communication: No difficulties  Cognition Arousal/Alertness: Awake/alert Behavior During Therapy: WFL for tasks assessed/performed Overall Cognitive Status: Within Functional Limits for tasks assessed                                        General Comments       Exercises     Assessment/Plan    PT Assessment Patient needs continued PT services  PT Problem List Decreased strength;Decreased activity tolerance;Decreased range of motion;Decreased balance;Decreased mobility;Decreased knowledge of use of DME;Decreased knowledge of precautions       PT Treatment Interventions DME instruction;Gait training;Stair training;Functional mobility training;Therapeutic activities;Therapeutic exercise;Balance training;Patient/family education    PT Goals (Current goals can be found in the Care Plan section)  Acute Rehab PT Goals Patient Stated Goal: to go home PT Goal Formulation: With patient Time For Goal Achievement: 12/24/20 Potential to Achieve Goals: Good    Frequency 7X/week   Barriers to discharge        Co-evaluation               AM-PAC PT "6 Clicks" Mobility  Outcome Measure Help needed turning from your back to your side while in a flat bed without using bedrails?: A Little Help needed moving from lying on your back to sitting on the side of a flat bed without using bedrails?: A Little Help needed moving to and from a bed to a chair (including a wheelchair)?: A Little Help needed standing up from a chair using your arms (e.g., wheelchair or bedside chair)?: A Little Help needed to walk in hospital room?: A Lot Help needed climbing 3-5 steps with a railing? : Total 6 Click Score: 15    End of Session Equipment Utilized During Treatment: Gait belt Activity Tolerance: Patient tolerated treatment well Patient left: in bed;with call bell/phone within reach (on stretcher in PACU) Nurse Communication: Mobility status PT Visit Diagnosis: Other abnormalities of gait and mobility (R26.89);Pain Pain - Right/Left: Right Pain - part of body: Hip    Time: 6967-8938 PT Time Calculation (min) (ACUTE ONLY): 16 min   Charges:   PT Evaluation $PT Eval Low Complexity: 1 Low          Cindee Salt, DPT  Acute Rehabilitation Services   Pager: [phone] Office: [phone]   Bradley Evans 12/10/2020, 4:50 PM

## 2020-12-10 NOTE — Transfer of Care (Signed)
Immediate Anesthesia Transfer of Care Note  Patient: Bradley Evans  Procedure(s) Performed: RIGHT TOTAL HIP ARTHROPLASTY ANTERIOR APPROACH (Right Hip)  Patient Location: PACU  Anesthesia Type:MAC and Spinal  Level of Consciousness: awake, alert  and oriented  Airway & Oxygen Therapy: Patient Spontanous Breathing  Post-op Assessment: Report given to RN and Post -op Vital signs reviewed and stable  Post vital signs: Reviewed and stable  Last Vitals:  Vitals Value Taken Time  BP 90/71 12/10/20 1402  Temp    Pulse 48 12/10/20 1403  Resp 13 12/10/20 1403  SpO2 95 % 12/10/20 1403  Vitals shown include unvalidated device data.  Last Pain:  Vitals:   12/10/20 1010  TempSrc:   PainSc: 0-No pain         Complications: No complications documented.

## 2020-12-10 NOTE — Anesthesia Preprocedure Evaluation (Signed)
Anesthesia Evaluation  Patient identified by MRN, date of birth, ID band Patient awake    Reviewed: Allergy & Precautions, NPO status   Airway Mallampati: II  TM Distance: >3 FB Neck ROM: Full    Dental  (+) Teeth Intact   Pulmonary sleep apnea and Continuous Positive Airway Pressure Ventilation ,    Pulmonary exam normal        Cardiovascular hypertension, Pt. on medications  Rhythm:Regular Rate:Normal     Neuro/Psych negative neurological ROS  negative psych ROS   GI/Hepatic negative GI ROS, Neg liver ROS,   Endo/Other  diabetes, Well Controlled, Type 2, Oral Hypoglycemic Agents  Renal/GU negative Renal ROS  negative genitourinary   Musculoskeletal  (+) Arthritis , Osteoarthritis,    Abdominal (+)  Abdomen: soft. Bowel sounds: normal.  Peds  Hematology negative hematology ROS (+)   Anesthesia Other Findings   Reproductive/Obstetrics                            Anesthesia Physical Anesthesia Plan  ASA: II  Anesthesia Plan: Spinal and MAC   Post-op Pain Management:    Induction:   PONV Risk Score and Plan: 1 and Ondansetron, Dexamethasone, Propofol infusion, Midazolam and Treatment may vary due to age or medical condition  Airway Management Planned: Simple Face Mask, Natural Airway and Nasal Cannula  Additional Equipment: None  Intra-op Plan:   Post-operative Plan:   Informed Consent: I have reviewed the patients History and Physical, chart, labs and discussed the procedure including the risks, benefits and alternatives for the proposed anesthesia with the patient or authorized representative who has indicated his/her understanding and acceptance.     Dental advisory given  Plan Discussed with: CRNA  Anesthesia Plan Comments: (Lab Results      Component                Value               Date                      WBC                      3.9 (L)             12/06/2020                 HGB                      14.8                12/06/2020                HCT                      45.1                12/06/2020                MCV                      88.6                12/06/2020                PLT  276                 12/06/2020           Lab Results      Component                Value               Date                      NA                       140                 12/06/2020                K                        4.3                 12/06/2020                CO2                      25                  12/06/2020                GLUCOSE                  108 (H)             12/06/2020                BUN                      13                  12/06/2020                CREATININE               1.06                12/06/2020                CALCIUM                  9.2                 12/06/2020                GFRNONAA                 >60                 12/06/2020          )        Anesthesia Quick Evaluation

## 2020-12-10 NOTE — Discharge Instructions (Signed)

## 2020-12-10 NOTE — Anesthesia Postprocedure Evaluation (Signed)
Anesthesia Post Note  Patient: Stanly Si  Procedure(s) Performed: RIGHT TOTAL HIP ARTHROPLASTY ANTERIOR APPROACH (Right Hip)     Patient location during evaluation: PACU Anesthesia Type: MAC and Spinal Level of consciousness: oriented and awake and alert Pain management: pain level controlled Vital Signs Assessment: post-procedure vital signs reviewed and stable Respiratory status: spontaneous breathing, respiratory function stable and patient connected to nasal cannula oxygen Cardiovascular status: blood pressure returned to baseline and stable Postop Assessment: no headache, no backache and no apparent nausea or vomiting Anesthetic complications: no   No complications documented.  Last Vitals:  Vitals:   12/10/20 1812 12/10/20 1927  BP: 102/66 (!) 102/56  Pulse: 75 90  Resp: 18 20  Temp: 36.8 C 36.7 C  SpO2: 96% 90%    Last Pain:  Vitals:   12/10/20 2133  TempSrc:   PainSc: 6                  Gabrella Stroh P Staci Carver

## 2020-12-11 ENCOUNTER — Encounter (HOSPITAL_COMMUNITY): Payer: Self-pay | Admitting: Orthopaedic Surgery

## 2020-12-11 LAB — GLUCOSE, CAPILLARY
Glucose-Capillary: 170 mg/dL — ABNORMAL HIGH (ref 70–99)
Glucose-Capillary: 178 mg/dL — ABNORMAL HIGH (ref 70–99)
Glucose-Capillary: 135 mg/dL — ABNORMAL HIGH (ref 70–99)
Glucose-Capillary: 142 mg/dL — ABNORMAL HIGH (ref 70–99)

## 2020-12-11 NOTE — Progress Notes (Signed)
Physical Therapy Treatment Patient Details Name: Bradley Evans MRN: [id number] DOB: 09/29/1956 Today's Date: 12/11/2020    History of Present Illness Pt is a 65 y/o male s/p R THA, direct anterior approach. PMH includes DM, HTN, and sleep apnea on CPAP.    PT Comments    Pt progressing slowly towards physical therapy goals. Pt was able to progress mobility however is still significantly limited by pain and weakness in the RLE. Stair training was deferred due to safety and fatigue. Will see in PM for further gait/mobility training.    Follow Up Recommendations  Follow surgeon's recommendation for DC plan and follow-up therapies;Supervision for mobility/OOB     Equipment Recommendations  Rolling walker with 5" wheels;3in1 (PT)    Recommendations for Other Services       Precautions / Restrictions Precautions Precautions: Fall Restrictions Weight Bearing Restrictions: Yes RLE Weight Bearing: Weight bearing as tolerated    Mobility  Bed Mobility Overal bed mobility: Needs Assistance Bed Mobility: Supine to Sit     Supine to sit: Min assist     General bed mobility comments: Increased time and assist provided for RLE support and trunk stability as pt transitioned to full sitting position.  Transfers Overall transfer level: Needs assistance Equipment used: Rolling walker (2 wheeled) Transfers: Sit to/from Stand Sit to Stand: Min assist         General transfer comment: Light min A for power-up to full stand from an elevated surface. VC's for hand placement on seated surface for safety.  Ambulation/Gait Ambulation/Gait assistance: Min assist Gait Distance (Feet): 50 Feet Assistive device: Rolling walker (2 wheeled) Gait Pattern/deviations: Narrow base of support;Step-to pattern;Decreased step length - right;Decreased step length - left;Decreased weight shift to left Gait velocity: Decreased Gait velocity interpretation: <1.31 ft/sec, indicative of household  ambulator General Gait Details: VC's for sequencing with the RW, improved posture, and increased heel strike on the R. Pt fatigued quickly (likely pain related), and was sweating by the time we returned to the room.   Stairs             Wheelchair Mobility    Modified Rankin (Stroke Patients Only)       Balance Overall balance assessment: Needs assistance Sitting-balance support: No upper extremity supported;Feet supported Sitting balance-Leahy Scale: Good     Standing balance support: Bilateral upper extremity supported Standing balance-Leahy Scale: Poor Standing balance comment: Reliant on BUE support                            Cognition Arousal/Alertness: Awake/alert Behavior During Therapy: WFL for tasks assessed/performed Overall Cognitive Status: Within Functional Limits for tasks assessed                                        Exercises Total Joint Exercises Ankle Circles/Pumps: 10 reps Quad Sets: 10 reps Short Arc Quad: 10 reps Heel Slides: 10 reps Hip ABduction/ADduction: 10 reps    General Comments        Pertinent Vitals/Pain Pain Assessment: Faces Faces Pain Scale: Hurts whole lot Pain Location: R hip Pain Descriptors / Indicators: Operative site guarding;Aching;Discomfort;Sore Pain Intervention(s): Limited activity within patient's tolerance;Monitored during session;Repositioned    Home Living                      Prior Function  PT Goals (current goals can now be found in the care plan section) Acute Rehab PT Goals Patient Stated Goal: to go home PT Goal Formulation: With patient Time For Goal Achievement: 12/24/20 Potential to Achieve Goals: Good Progress towards PT goals: Progressing toward goals    Frequency    7X/week      PT Plan Current plan remains appropriate    Co-evaluation              AM-PAC PT "6 Clicks" Mobility   Outcome Measure  Help needed turning  from your back to your side while in a flat bed without using bedrails?: A Little Help needed moving from lying on your back to sitting on the side of a flat bed without using bedrails?: A Little Help needed moving to and from a bed to a chair (including a wheelchair)?: A Little Help needed standing up from a chair using your arms (e.g., wheelchair or bedside chair)?: A Little Help needed to walk in hospital room?: A Lot Help needed climbing 3-5 steps with a railing? : Total 6 Click Score: 15    End of Session Equipment Utilized During Treatment: Gait belt Activity Tolerance: Patient limited by pain;Patient limited by fatigue Patient left: in chair;with call bell/phone within reach (on stretcher in PACU) Nurse Communication: Mobility status PT Visit Diagnosis: Other abnormalities of gait and mobility (R26.89);Pain Pain - Right/Left: Right Pain - part of body: Hip     Time: 0825-0902 PT Time Calculation (min) (ACUTE ONLY): 37 min  Charges:  $Gait Training: 8-22 mins $Therapeutic Exercise: 8-22 mins                     Conni Slipper, PT, DPT Acute Rehabilitation Services Pager: [phone] Office: [phone]    [name] 12/11/2020, 12:38 PM

## 2020-12-11 NOTE — TOC Transition Note (Signed)
Transition of Care [facility]) - CM/SW Discharge Note   Patient Details  Name: Bradley Evans MRN: [id number] Date of Birth: 15-May-1956  [facility]) CM/SW Contact:  Lorri Frederick, LCSW Phone Number: 12/11/2020, 12:59 PM   Clinical Narrative:  CSW contacted [facility], who is providing charity HH this week regarding HH for this pt, however, pt is from [place] and [facility] does not cover [place], per Amy.  CSW did arranged DME walker and 3n1 with [employer].  CSW discussed potential outpt PT services, however pt states he does not have the ability to pay out of pocket for PT services.             Patient Goals and CMS Choice        Discharge Placement                       Discharge Plan and Services                DME Arranged: 3-N-1,Walker rolling DME Agency: [employer] Date DME Agency Contacted: 12/11/20 Time DME Agency Contacted: [phone] Representative spoke with at DME Agency: Silvio Pate HH Arranged:  (unable to arrange-charity HH in [place])          Social Determinants of Health (SDOH) Interventions     Readmission Risk Interventions No flowsheet data found.

## 2020-12-11 NOTE — Progress Notes (Signed)
Patient ID: Bradley Evans, male   DOB: Mar 20, 1956, 65 y.o.   MRN: [id number] The patient needed to stay last evening due to being a fall risk and just not safe from a mobility stand point for going home late.  He looks good this am and his vitals are stable.  His right hip dressing does have significant bloody drainage, so I changed it.  Hopefully he will do well with therapy today and can be safely discharged to home with his daughter.

## 2020-12-11 NOTE — Progress Notes (Signed)
Physical Therapy Treatment Patient Details Name: Bradley Evans MRN: [id number] DOB: 06-Jan-1956 Today's Date: 12/11/2020    History of Present Illness Pt is a 65 y/o male s/p R THA, direct anterior approach. PMH includes DM, HTN, and sleep apnea on CPAP.    PT Comments    Pt fatigued this afternoon with increased pain with mobility. Pt reports pain is his limiting factor, however question level of motor control in the R quad and hip flexor as weakness appears to be significant in addition to pain. Pt was unable to progress to stair training this session due to fatigue and pain. Noted pt is not eligible for charity [facility] services, and will continue to follow BID while admitted as able to progress pt per POC.    Follow Up Recommendations  Follow surgeon's recommendation for DC plan and follow-up therapies;Supervision for mobility/OOB     Equipment Recommendations  Rolling walker with 5" wheels;3in1 (PT)    Recommendations for Other Services       Precautions / Restrictions Precautions Precautions: Fall Restrictions Weight Bearing Restrictions: Yes RLE Weight Bearing: Weight bearing as tolerated    Mobility  Bed Mobility Overal bed mobility: Needs Assistance Bed Mobility: Supine to Sit     Supine to sit: Min assist     General bed mobility comments: Pt sitting up in recliner chair upon arrival.  Transfers Overall transfer level: Needs assistance Equipment used: Rolling walker (2 wheeled) Transfers: Sit to/from Stand Sit to Stand: Min assist         General transfer comment: Light min A for power-up to full stand from recliner. VC's for hand placement on seated surface for safety.  Ambulation/Gait Ambulation/Gait assistance: Min assist Gait Distance (Feet): 60 Feet Assistive device: Rolling walker (2 wheeled) Gait Pattern/deviations: Narrow base of support;Step-to pattern;Decreased step length - right;Decreased step length - left;Decreased weight shift to left Gait  velocity: Decreased Gait velocity interpretation: <1.31 ft/sec, indicative of household ambulator General Gait Details: VC's for sequencing with the RW, improved posture, and increased heel strike on the R. Pt fatigued quickly (likely pain related), and was sweating by the time we returned to the room.   Stairs             Wheelchair Mobility    Modified Rankin (Stroke Patients Only)       Balance Overall balance assessment: Needs assistance Sitting-balance support: No upper extremity supported;Feet supported Sitting balance-Leahy Scale: Good     Standing balance support: Bilateral upper extremity supported Standing balance-Leahy Scale: Poor Standing balance comment: Reliant on BUE support                            Cognition Arousal/Alertness: Awake/alert Behavior During Therapy: WFL for tasks assessed/performed Overall Cognitive Status: Within Functional Limits for tasks assessed                                        Exercises Total Joint Exercises Ankle Circles/Pumps: 10 reps Quad Sets: 10 reps;Right;Seated Short Arc Quad: 10 reps Heel Slides: 10 reps Hip ABduction/ADduction: 10 reps;Right;Seated Long Arc Quad: 10 reps;Right;Seated Marching in Standing: 5 reps;Right;Standing    General Comments        Pertinent Vitals/Pain Pain Assessment: Faces Faces Pain Scale: Hurts whole lot Pain Location: R hip Pain Descriptors / Indicators: Operative site guarding;Aching;Discomfort;Sore Pain Intervention(s): Limited activity within patient's tolerance;Monitored  during session;Repositioned    Home Living                      Prior Function            PT Goals (current goals can now be found in the care plan section) Acute Rehab PT Goals Patient Stated Goal: to go home PT Goal Formulation: With patient Time For Goal Achievement: 12/24/20 Potential to Achieve Goals: Good Progress towards PT goals: Progressing toward  goals    Frequency    7X/week      PT Plan Current plan remains appropriate    Co-evaluation              AM-PAC PT "6 Clicks" Mobility   Outcome Measure  Help needed turning from your back to your side while in a flat bed without using bedrails?: A Little Help needed moving from lying on your back to sitting on the side of a flat bed without using bedrails?: A Little Help needed moving to and from a bed to a chair (including a wheelchair)?: A Little Help needed standing up from a chair using your arms (e.g., wheelchair or bedside chair)?: A Little Help needed to walk in hospital room?: A Lot Help needed climbing 3-5 steps with a railing? : Total 6 Click Score: 15    End of Session Equipment Utilized During Treatment: Gait belt Activity Tolerance: Patient limited by pain;Patient limited by fatigue Patient left: in chair;with call bell/phone within reach (on stretcher in PACU) Nurse Communication: Mobility status PT Visit Diagnosis: Other abnormalities of gait and mobility (R26.89);Pain Pain - Right/Left: Right Pain - part of body: Hip     Time: 1300-1404 PT Time Calculation (min) (ACUTE ONLY): 64 min  Charges:  $Gait Training: 23-37 mins $Therapeutic Exercise: 8-22 mins $Therapeutic Activity: 8-22 mins                     Conni Slipper, PT, DPT Acute Rehabilitation Services Pager: [phone] Office: [phone]    [name] 12/11/2020, 3:01 PM

## 2020-12-12 LAB — CBC
HCT: 37.6 % — ABNORMAL LOW (ref 39.0–52.0)
Hemoglobin: 11.7 g/dL — ABNORMAL LOW (ref 13.0–17.0)
MCH: 28.5 pg (ref 26.0–34.0)
MCHC: 31.1 g/dL (ref 30.0–36.0)
MCV: 91.5 fL (ref 80.0–100.0)
Platelets: 210 10*3/uL (ref 150–400)
RBC: 4.11 MIL/uL — ABNORMAL LOW (ref 4.22–5.81)
RDW: 13.9 % (ref 11.5–15.5)
WBC: 6.9 10*3/uL (ref 4.0–10.5)
nRBC: 0 % (ref 0.0–0.2)

## 2020-12-12 LAB — GLUCOSE, CAPILLARY
Glucose-Capillary: 134 mg/dL — ABNORMAL HIGH (ref 70–99)
Glucose-Capillary: 108 mg/dL — ABNORMAL HIGH (ref 70–99)
Glucose-Capillary: 126 mg/dL — ABNORMAL HIGH (ref 70–99)
Glucose-Capillary: 137 mg/dL — ABNORMAL HIGH (ref 70–99)

## 2020-12-12 NOTE — TOC Progression Note (Signed)
Transition of Care [facility]) - Progression Note    Patient Details  Name: Bradley Evans MRN: [id number] Date of Birth: 11-03-1956  [facility]) CM/SW Contact  Lorri Frederick, LCSW Phone Number: 12/12/2020, 12:47 PM  Clinical Narrative:   CSW contacted by [facility] regarding pt having medicaid.  After [facility] reviewed, pt was found to have recently lost his medicaid.  Pt reports he did receive letters regarding his medicaid but is not sure exactly what they said he needed to do.  CSW attempted to contact financial counseling for assistance but did not receive reply back.  After further review, [facility] reports they are unable to see pt even if he reapplies for Medicaid.         Expected Discharge Plan and Services           Expected Discharge Date: 12/11/20               DME Arranged: 3-N-1,Walker rolling DME Agency: [employer] Date DME Agency Contacted: 12/11/20 Time DME Agency Contacted: [phone] Representative spoke with at DME Agency: Silvio Pate HH Arranged:  (unable to arrange-charity HH in [place])           Social Determinants of Health (SDOH) Interventions    Readmission Risk Interventions No flowsheet data found.

## 2020-12-12 NOTE — Progress Notes (Signed)
Physical Therapy Treatment Patient Details Name: Bradley Evans MRN: [id number] DOB: [birth year] Today's Date: 12/12/2020    History of Present Illness Pt is a 65 y/o male s/p R THA, direct anterior approach. PMH includes DM, HTN, and sleep apnea on CPAP.    PT Comments    Pt progressing slowly towards physical therapy goals. He is motivated to work with therapy however functional gains are small each session. Pt continues to require assistance for RLE advancement ~50% of the time during gait training. Feel pt could benefit from another day to work with physical therapy to improve gross mobility, but also in hopes to progress to higher level functional tasks such as a car transfer and stair training. Will continue to follow and progress as able per POC.     Follow Up Recommendations  Follow surgeon's recommendation for DC plan and follow-up therapies;Supervision for mobility/OOB     Equipment Recommendations  Rolling walker with 5" wheels;3in1 (PT)    Recommendations for Other Services       Precautions / Restrictions Precautions Precautions: Fall Restrictions Weight Bearing Restrictions: Yes RLE Weight Bearing: Weight bearing as tolerated    Mobility  Bed Mobility               General bed mobility comments: Pt was received sitting up in recliner chair.  Transfers Overall transfer level: Needs assistance Equipment used: Rolling walker (2 wheeled) Transfers: Sit to/from Stand Sit to Stand: Min assist;Min guard         General transfer comment: Light min A for power-up to full stand from recliner. Progressed to min guard assist after 5x sit<>stand activity. VC's for hand placement on seated surface for safety.  Ambulation/Gait Ambulation/Gait assistance: Min guard;Min assist Gait Distance (Feet): 50 Feet Assistive device: Rolling walker (2 wheeled) Gait Pattern/deviations: Narrow base of support;Step-to pattern;Decreased step length - right;Decreased step length -  left;Decreased weight shift to left Gait velocity: Decreased Gait velocity interpretation: <1.31 ft/sec, indicative of household ambulator General Gait Details: VC's for sequencing with the RW, improved posture, and increased heel strike on the R. Pt fatigued quickly (likely pain related), and required therapist assist for advancement on RLE at times.   Stairs             Wheelchair Mobility    Modified Rankin (Stroke Patients Only)       Balance Overall balance assessment: Needs assistance Sitting-balance support: No upper extremity supported;Feet supported Sitting balance-Leahy Scale: Good     Standing balance support: Bilateral upper extremity supported Standing balance-Leahy Scale: Poor Standing balance comment: Reliant on BUE support                            Cognition Arousal/Alertness: Awake/alert Behavior During Therapy: WFL for tasks assessed/performed Overall Cognitive Status: Within Functional Limits for tasks assessed                                        Exercises Total Joint Exercises Marching in Standing: 5 reps;Right;Standing Other Exercises Other Exercises: 5x sit<>stand Other Exercises: step forward/back with RLE only as pre-gait activity (x5)    General Comments        Pertinent Vitals/Pain Pain Assessment: Faces Faces Pain Scale: Hurts whole lot Pain Location: R hip Pain Descriptors / Indicators: Operative site guarding;Aching;Discomfort;Sore Pain Intervention(s): Limited activity within patient's tolerance;Monitored during session;Repositioned  Home Living                      Prior Function            PT Goals (current goals can now be found in the care plan section) Acute Rehab PT Goals Patient Stated Goal: to go home PT Goal Formulation: With patient Time For Goal Achievement: 12/24/20 Potential to Achieve Goals: Good Progress towards PT goals: Progressing toward goals     Frequency    7X/week      PT Plan Current plan remains appropriate    Co-evaluation              AM-PAC PT "6 Clicks" Mobility   Outcome Measure  Help needed turning from your back to your side while in a flat bed without using bedrails?: A Little Help needed moving from lying on your back to sitting on the side of a flat bed without using bedrails?: A Little Help needed moving to and from a bed to a chair (including a wheelchair)?: A Little Help needed standing up from a chair using your arms (e.g., wheelchair or bedside chair)?: A Little Help needed to walk in hospital room?: A Lot Help needed climbing 3-5 steps with a railing? : Total 6 Click Score: 15    End of Session Equipment Utilized During Treatment: Gait belt Activity Tolerance: Patient limited by pain;Patient limited by fatigue Patient left: in chair;with call bell/phone within reach Nurse Communication: Mobility status PT Visit Diagnosis: Other abnormalities of gait and mobility (R26.89);Pain Pain - Right/Left: Right Pain - part of body: Hip     Time: 1914-7829 PT Time Calculation (min) (ACUTE ONLY): 28 min  Charges:  $Gait Training: 8-22 mins $Therapeutic Exercise: 8-22 mins                     Conni Slipper, PT, DPT Acute Rehabilitation Services Pager: [phone] Office: [phone]    [name] 12/12/2020, 3:27 PM

## 2020-12-12 NOTE — Progress Notes (Signed)
Physical Therapy Treatment Patient Details Name: Bradley Evans MRN: [id number] DOB: 10-19-1956 Today's Date: 12/12/2020    History of Present Illness Pt is a 65 y/o male s/p R THA, direct anterior approach. PMH includes DM, HTN, and sleep apnea on CPAP.    PT Comments    Pt progressing slowly towards physical therapy goals. Pt with increased pain with movement this morning, and required assist to advance RLE during gait training. Pain and fatigue limiting pt's ability to attempt stair training at this time. Will follow-up this afternoon for second session and to progress mobility. Will continue to follow.     Follow Up Recommendations  Follow surgeon's recommendation for DC plan and follow-up therapies;Supervision for mobility/OOB     Equipment Recommendations  Rolling walker with 5" wheels;3in1 (PT)    Recommendations for Other Services       Precautions / Restrictions Precautions Precautions: Fall Restrictions Weight Bearing Restrictions: Yes RLE Weight Bearing: Weight bearing as tolerated    Mobility  Bed Mobility Overal bed mobility: Needs Assistance Bed Mobility: Supine to Sit     Supine to sit: Min assist Sit to supine: Min assist   General bed mobility comments: Increased time and effort required as pt very painful with movement. Min assist required to fully transition to EOB.  Transfers Overall transfer level: Needs assistance Equipment used: Rolling walker (2 wheeled) Transfers: Sit to/from Stand Sit to Stand: Min assist         General transfer comment: Light min A for power-up to full stand from recliner. VC's for hand placement on seated surface for safety.  Ambulation/Gait Ambulation/Gait assistance: Min guard;Min assist Gait Distance (Feet): 50 Feet Assistive device: Rolling walker (2 wheeled) Gait Pattern/deviations: Narrow base of support;Step-to pattern;Decreased step length - right;Decreased step length - left;Decreased weight shift to left Gait  velocity: Decreased Gait velocity interpretation: <1.31 ft/sec, indicative of household ambulator General Gait Details: VC's for sequencing with the RW, improved posture, and increased heel strike on the R. Pt fatigued quickly (likely pain related), and required therapist assist for advancement on RLE at times.   Stairs             Wheelchair Mobility    Modified Rankin (Stroke Patients Only)       Balance Overall balance assessment: Needs assistance Sitting-balance support: No upper extremity supported;Feet supported Sitting balance-Leahy Scale: Good     Standing balance support: Bilateral upper extremity supported Standing balance-Leahy Scale: Poor Standing balance comment: Reliant on BUE support                            Cognition Arousal/Alertness: Awake/alert Behavior During Therapy: WFL for tasks assessed/performed Overall Cognitive Status: Within Functional Limits for tasks assessed                                        Exercises Total Joint Exercises Ankle Circles/Pumps: 10 reps Quad Sets: 10 reps;Right;Seated Heel Slides: 10 reps Long Arc Quad: 5 reps;Right;Seated    General Comments        Pertinent Vitals/Pain Pain Assessment: Faces Faces Pain Scale: Hurts whole lot Pain Location: R hip Pain Descriptors / Indicators: Operative site guarding;Aching;Discomfort;Sore Pain Intervention(s): Limited activity within patient's tolerance;Monitored during session;Repositioned;Ice applied    Home Living  Prior Function            PT Goals (current goals can now be found in the care plan section) Acute Rehab PT Goals Patient Stated Goal: to go home PT Goal Formulation: With patient Time For Goal Achievement: 12/24/20 Potential to Achieve Goals: Good Progress towards PT goals: Progressing toward goals    Frequency    7X/week      PT Plan Current plan remains appropriate     Co-evaluation              AM-PAC PT "6 Clicks" Mobility   Outcome Measure  Help needed turning from your back to your side while in a flat bed without using bedrails?: A Little Help needed moving from lying on your back to sitting on the side of a flat bed without using bedrails?: A Little Help needed moving to and from a bed to a chair (including a wheelchair)?: A Little Help needed standing up from a chair using your arms (e.g., wheelchair or bedside chair)?: A Little Help needed to walk in hospital room?: A Lot Help needed climbing 3-5 steps with a railing? : Total 6 Click Score: 15    End of Session Equipment Utilized During Treatment: Gait belt Activity Tolerance: Patient limited by pain;Patient limited by fatigue Patient left: in chair;with call bell/phone within reach Nurse Communication: Mobility status PT Visit Diagnosis: Other abnormalities of gait and mobility (R26.89);Pain Pain - Right/Left: Right Pain - part of body: Hip     Time: 3005-1102 PT Time Calculation (min) (ACUTE ONLY): 42 min  Charges:  $Gait Training: 23-37 mins $Therapeutic Exercise: 8-22 mins                     Bradley Evans, PT, DPT Acute Rehabilitation Services Pager: [phone] Office: [phone]    Bradley Evans 12/12/2020, 11:29 AM

## 2020-12-12 NOTE — Progress Notes (Signed)
Patient ID: Bradley Evans, male   DOB: May 29, 1956, 65 y.o.   MRN: [id number] Unfortunately, he has had quite limited mobility.  His BP is low this am and he is slightly tachycardic.  His incision looks good and the dressing was changed.  His right hip is stable.  Will order a CBC this am.  Encouraged PO fluids.  Hopefully can be discharged to home later today vs tomorrow with his sister, who is a Engineer, civil (consulting).

## 2020-12-13 LAB — GLUCOSE, CAPILLARY
Glucose-Capillary: 121 mg/dL — ABNORMAL HIGH (ref 70–99)
Glucose-Capillary: 130 mg/dL — ABNORMAL HIGH (ref 70–99)

## 2020-12-13 NOTE — Discharge Summary (Signed)
Patient ID: Bradley Evans MRN: [id number] DOB/AGE: 1955-12-27 65 y.o.  Admit date: 12/10/2020 Discharge date: 12/13/2020  Admission Diagnoses:  Principal Problem:   Unilateral primary osteoarthritis, right hip Active Problems:   S/P total right hip arthroplasty   Discharge Diagnoses:  Same  Past Medical History:  Diagnosis Date  . Arthritis    knees  . Diabetes mellitus without complication (HCC)   . Sleep apnea     Surgeries: Procedure(s): RIGHT TOTAL HIP ARTHROPLASTY ANTERIOR APPROACH on 12/10/2020   Consultants:   Discharged Condition: Improved  Hospital Course: Bradley Evans is an 65 y.o. male who was admitted 12/10/2020 for operative treatment ofUnilateral primary osteoarthritis, right hip. Patient has severe unremitting pain that affects sleep, daily activities, and work/hobbies. After pre-op clearance the patient was taken to the operating room on 12/10/2020 and underwent  Procedure(s): RIGHT TOTAL HIP ARTHROPLASTY ANTERIOR APPROACH.    Patient was given perioperative antibiotics:  Anti-infectives (From admission, onward)   Start     Dose/Rate Route Frequency Ordered Stop   12/10/20 0945  ceFAZolin (ANCEF) IVPB 2g/100 mL premix        2 g 200 mL/hr over 30 Minutes Intravenous On call to O.R. 12/10/20 0943 12/10/20 1245       Patient was given sequential compression devices, early ambulation, and chemoprophylaxis to prevent DVT.  Patient benefited maximally from hospital stay and there were no complications.    Recent vital signs:  Patient Vitals for the past 24 hrs:  BP Temp Temp src Pulse Resp SpO2  12/13/20 1131 131/86 99.6 F (37.6 C) Oral 92 18 96 %  12/13/20 0748 111/66 99.1 F (37.3 C) Oral 94 19 97 %  12/13/20 0351 109/60 (!) 100.5 F (38.1 C) Oral 96 20 93 %  12/12/20 2344 (!) 110/59 99.8 F (37.7 C) Oral 90 18 93 %  12/12/20 1950 107/64 99.1 F (37.3 C) Oral (!) 104 20 96 %  12/12/20 1558 103/60 98.5 F (36.9 C) Oral 99 18 100 %     Recent laboratory  studies:  Recent Labs    12/12/20 1019  WBC 6.9  HGB 11.7*  HCT 37.6*  PLT 210     Discharge Medications:   Allergies as of 12/13/2020   No Known Allergies     Medication List    TAKE these medications   aspirin 81 MG chewable tablet Commonly known as: Aspirin 81 Chew 1 tablet (81 mg total) by mouth 2 (two) times daily at 8 am and 10 pm.   atorvastatin 40 MG tablet Commonly known as: LIPITOR Take 40 mg by mouth daily.   dapagliflozin propanediol 10 MG Tabs tablet Commonly known as: FARXIGA Take 10 mg by mouth daily.   lisinopril 10 MG tablet Commonly known as: ZESTRIL Take 10 mg by mouth daily.   metFORMIN 1000 MG tablet Commonly known as: GLUCOPHAGE Take 1,000 mg by mouth 2 (two) times daily with a meal.   oxyCODONE 5 MG immediate release tablet Commonly known as: Roxicodone Take 1-2 tablets (5-10 mg total) by mouth every 4 (four) hours as needed for severe pain.   Ozempic (0.25 or 0.5 MG/DOSE) 2 MG/1.5ML Sopn Generic drug: Semaglutide(0.25 or 0.5MG /DOS) Inject 0.25 mg into the skin every Monday.   tiZANidine 4 MG tablet Commonly known as: Zanaflex Take 1 tablet (4 mg total) by mouth every 8 (eight) hours as needed for muscle spasms.            Durable Medical Equipment  (From admission, onward)  Start     Ordered   12/10/20 1544  DME 3 n 1  Once        12/10/20 1543   12/10/20 1544  DME Walker rolling  Once       Question Answer Comment  Walker: With 5 Inch Wheels   Patient needs a walker to treat with the following condition Status post total replacement of right hip      12/10/20 1543          Diagnostic Studies: DG Pelvis Portable  Result Date: 12/10/2020 CLINICAL DATA:  Status post total hip replacement, right.  Postop. EXAM: PORTABLE PELVIS 1-2 VIEWS COMPARISON:  Intraoperative fluoroscopic images of the right hip performed earlier the same day 12/10/2020. Radiographs of the right hip and pelvis 07/10/2020. FINDINGS: Sequela of  right total hip arthroplasty. The femoral and acetabular components appear well seated. 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 IMPRESSION: Sequela of right total hip arthroplasty. The femoral and acetabular components appear well seated. No unexpected finding. 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 Electronically Signed   By: Jackey Loge DO   On: 12/10/2020 14:58   DG C-Arm 1-60 Min  Result Date: 12/10/2020 CLINICAL DATA:  Surgery, elective. Additional history provided: Right total hip arthroplasty anterior approach. Provided fluoroscopy time 20 seconds (4.23 mGy). EXAM: OPERATIVE right HIP (WITH PELVIS IF PERFORMED) 5 VIEWS TECHNIQUE: Fluoroscopic spot image(s) were submitted for interpretation post-operatively. COMPARISON:  Radiographs of the right hip and pelvis 07/10/2020. FINDINGS: Five intraoperative fluoroscopic images of the right hip are submitted. The images demonstrate sequela of interval right total hip arthroplasty. The femoral and acetabular components appear well-seated. No unexpected finding. IMPRESSION: Five intraoperative fluoroscopic images from right total hip arthroplasty, as described. Electronically Signed   By: Jackey Loge DO   On: 12/10/2020 14:56   DG HIP OPERATIVE UNILAT WITH PELVIS RIGHT  Result Date: 12/10/2020 CLINICAL DATA:  Surgery, elective. Additional history provided: Right total hip arthroplasty anterior approach. Provided fluoroscopy time 20 seconds (4.23 mGy). EXAM: OPERATIVE right HIP (WITH PELVIS IF PERFORMED) 5 VIEWS TECHNIQUE: Fluoroscopic spot image(s) were submitted for interpretation post-operatively. COMPARISON:  Radiographs of the right hip and pelvis 07/10/2020. FINDINGS: Five intraoperative fluoroscopic images of the right hip are submitted. The images demonstrate sequela of interval right total hip arthroplasty. The femoral and acetabular components appear well-seated. No unexpected finding. IMPRESSION: Five  intraoperative fluoroscopic images from right total hip arthroplasty, as described. Electronically Signed   By: Jackey Loge DO   On: 12/10/2020 14:56    Disposition: Discharge disposition: 01-Home or Self Care       Discharge Instructions    Discharge patient   Complete by: As directed    Discharge disposition: 01-Home or Self Care   Discharge patient date: 12/10/2020       Follow-up Information    Kathryne Hitch, MD. Schedule an appointment as soon as possible for a visit in 2 weeks.   Specialty: Orthopedic Surgery Contact information: [address] [place] [address] [phone]                Signed: Kathryne Hitch 12/13/2020, 3:30 PM

## 2020-12-13 NOTE — Plan of Care (Signed)
Patient alert and oriented, voiding adequately with no c/o pain or distress. Patient adequately ready for discharged

## 2020-12-13 NOTE — Progress Notes (Signed)
Physical Therapy Treatment Patient Details Name: Bradley Evans MRN: [id number] DOB: 1956-10-31 Today's Date: 12/13/2020    History of Present Illness Pt is a 65 y/o male s/p R THA, direct anterior approach. PMH includes DM, HTN, and sleep apnea on CPAP.    PT Comments    Pt progressing towards physical therapy goals with improved gait this session. Pt advanced ambulation distance by 100' and demonstrated good advance of RLE. When pt focused on increased heel strike, RLE control suffered, however pt was able to achieve min heel strike naturally. Focus of session was ambulation as this has been the main limiting factor for the pt 2 pain. Plan is for initiation of stair training this afternoon. Will continue to follow.     Follow Up Recommendations  Follow surgeon's recommendation for DC plan and follow-up therapies;Supervision for mobility/OOB     Equipment Recommendations  Rolling walker with 5" wheels;3in1 (PT)    Recommendations for Other Services       Precautions / Restrictions Precautions Precautions: Fall Restrictions Weight Bearing Restrictions: Yes RLE Weight Bearing: Weight bearing as tolerated    Mobility  Bed Mobility               General bed mobility comments: Pt was received sitting up in recliner chair.  Transfers Overall transfer level: Needs assistance Equipment used: Rolling walker (2 wheeled) Transfers: Sit to/from Stand Sit to Stand: Min guard         General transfer comment: Close guard for safety. Pt was able to power-up to full stand without assistance.  Ambulation/Gait Ambulation/Gait assistance: Min guard Gait Distance (Feet): 150 Feet Assistive device: Rolling walker (2 wheeled) Gait Pattern/deviations: Narrow base of support;Step-to pattern;Decreased step length - right;Decreased step length - left;Decreased weight shift to left Gait velocity: Decreased Gait velocity interpretation: <1.31 ft/sec, indicative of household  ambulator General Gait Details: VC's for improved posture, sequencing with the RW, and increased heel strike. Pt able to advance RLE this session without assistance.   Stairs             Wheelchair Mobility    Modified Rankin (Stroke Patients Only)       Balance Overall balance assessment: Needs assistance Sitting-balance support: No upper extremity supported;Feet supported Sitting balance-Leahy Scale: Good     Standing balance support: Bilateral upper extremity supported Standing balance-Leahy Scale: Poor Standing balance comment: Reliant on BUE support                            Cognition Arousal/Alertness: Awake/alert Behavior During Therapy: WFL for tasks assessed/performed Overall Cognitive Status: Within Functional Limits for tasks assessed                                        Exercises Total Joint Exercises Ankle Circles/Pumps: 10 reps Quad Sets: 10 reps;Right;Seated Hip ABduction/ADduction: 10 reps;Right;Seated Long Arc Quad: 10 reps;Right;Seated    General Comments        Pertinent Vitals/Pain Pain Assessment: Faces Faces Pain Scale: Hurts whole lot Pain Location: R hip Pain Descriptors / Indicators: Operative site guarding;Aching;Discomfort;Sore Pain Intervention(s): Limited activity within patient's tolerance;Monitored during session;Repositioned    Home Living                      Prior Function            PT  Goals (current goals can now be found in the care plan section) Acute Rehab PT Goals Patient Stated Goal: to go home PT Goal Formulation: With patient Time For Goal Achievement: 12/24/20 Potential to Achieve Goals: Good Progress towards PT goals: Progressing toward goals    Frequency    7X/week      PT Plan Current plan remains appropriate    Co-evaluation              AM-PAC PT "6 Clicks" Mobility   Outcome Measure  Help needed turning from your back to your side while in  a flat bed without using bedrails?: A Little Help needed moving from lying on your back to sitting on the side of a flat bed without using bedrails?: A Little Help needed moving to and from a bed to a chair (including a wheelchair)?: A Little Help needed standing up from a chair using your arms (e.g., wheelchair or bedside chair)?: A Little Help needed to walk in hospital room?: A Lot Help needed climbing 3-5 steps with a railing? : Total 6 Click Score: 15    End of Session Equipment Utilized During Treatment: Gait belt Activity Tolerance: Patient limited by pain;Patient limited by fatigue Patient left: in chair;with call bell/phone within reach Nurse Communication: Mobility status PT Visit Diagnosis: Other abnormalities of gait and mobility (R26.89);Pain Pain - Right/Left: Right Pain - part of body: Hip     Time: 1000-1028 PT Time Calculation (min) (ACUTE ONLY): 28 min  Charges:  $Gait Training: 8-22 mins $Therapeutic Exercise: 8-22 mins                     Conni Slipper, PT, DPT Acute Rehabilitation Services Pager: [phone] Office: [phone]    [name] 12/13/2020, 12:26 PM

## 2020-12-13 NOTE — Progress Notes (Signed)
Patient adequately ready for discharged home. Patient and daughter was given discharged instructions with stated understanding of instructions given. Patient has an appointment with Dr. Magnus Ivan on February 15th for  Follow-up

## 2020-12-13 NOTE — Progress Notes (Signed)
Physical Therapy Progress Note  Assessment: Pt progressing towards physical therapy goals. Was able to progress to stair training this session and daughter was present for education as well. We reviewed safety, positioning recommendations, car transfer, and LB dressing techniques. Will continue to follow until discharge, however pt anticipates d/c home this afternoon.    12/13/20 1425  PT Visit Information  Last PT Received On 12/13/20  Assistance Needed +1  History of Present Illness Pt is a 65 y/o male s/p R THA, direct anterior approach. PMH includes DM, HTN, and sleep apnea on CPAP.  Subjective Data  Patient Stated Goal to go home  Precautions  Precautions Fall  Restrictions  Weight Bearing Restrictions Yes  RLE Weight Bearing WBAT  Pain Assessment  Pain Assessment Faces  Faces Pain Scale 8  Pain Location R hip  Pain Descriptors / Indicators Operative site guarding;Aching;Discomfort;Sore  Pain Intervention(s) Monitored during session;Limited activity within patient's tolerance;Repositioned  Cognition  Arousal/Alertness Awake/alert  Behavior During Therapy WFL for tasks assessed/performed  Overall Cognitive Status Within Functional Limits for tasks assessed  Bed Mobility  General bed mobility comments Pt was received sitting up in recliner chair.  Transfers  Overall transfer level Needs assistance  Equipment used Rolling walker (2 wheeled)  Transfers Sit to/from Stand  Sit to Stand Min guard  General transfer comment Close guard for safety. Pt was able to power-up to full stand without assistance.  Ambulation/Gait  Ambulation/Gait assistance Min guard  Gait Distance (Feet) 150 Feet  Assistive device Rolling walker (2 wheeled)  Gait Pattern/deviations Narrow base of support;Step-to pattern;Decreased step length - right;Decreased step length - left;Decreased weight shift to left  General Gait Details VC's for improved posture, sequencing with the RW, and increased heel strike.  Pt able to advance RLE this session without assistance.  Gait velocity Decreased  Gait velocity interpretation <1.31 ft/sec, indicative of household ambulator  Stairs Yes  Stairs assistance Min guard;Min assist  Stair Management One rail Right;Step to pattern;Sideways  Number of Stairs 4  General stair comments VC's for sequencing and general safety. Pt required min assist initially however progressed to min guard assist by end of session.  Balance  Overall balance assessment Needs assistance  Sitting-balance support No upper extremity supported;Feet supported  Sitting balance-Leahy Scale Good  Standing balance support Bilateral upper extremity supported  Standing balance-Leahy Scale Poor  Standing balance comment Reliant on BUE support  Other Exercises  Other Exercises Verbally reviewed HEP  PT - End of Session  Equipment Utilized During Treatment Gait belt  Activity Tolerance Patient limited by pain;Patient limited by fatigue  Patient left in chair;with call bell/phone within reach  Nurse Communication Mobility status   PT - Assessment/Plan  PT Plan Current plan remains appropriate  PT Visit Diagnosis Other abnormalities of gait and mobility (R26.89);Pain  Pain - Right/Left Right  Pain - part of body Hip  PT Frequency (ACUTE ONLY) 7X/week  Follow Up Recommendations Follow surgeon's recommendation for DC plan and follow-up therapies;Supervision for mobility/OOB  PT equipment Rolling walker with 5" wheels;3in1 (PT)  AM-PAC PT "6 Clicks" Mobility Outcome Measure (Version 2)  Help needed turning from your back to your side while in a flat bed without using bedrails? 3  Help needed moving from lying on your back to sitting on the side of a flat bed without using bedrails? 3  Help needed moving to and from a bed to a chair (including a wheelchair)? 3  Help needed standing up from a chair using your  arms (e.g., wheelchair or bedside chair)? 3  Help needed to walk in hospital room? 3   Help needed climbing 3-5 steps with a railing?  3  6 Click Score 18  Consider Recommendation of Discharge To: Home with [facility]  PT Goal Progression  Progress towards PT goals Progressing toward goals  Acute Rehab PT Goals  PT Goal Formulation With patient  Time For Goal Achievement 12/24/20  Potential to Achieve Goals Good  PT Time Calculation  PT Start Time (ACUTE ONLY) 1403  PT Stop Time (ACUTE ONLY) 1423  PT Time Calculation (min) (ACUTE ONLY) 20 min  PT General Charges  $$ ACUTE PT VISIT 1 Visit  PT Treatments  $Gait Training 8-22 mins   Conni Slipper, PT, DPT Acute Rehabilitation Services Pager: [phone] Office: [phone]

## 2020-12-24 ENCOUNTER — Encounter: Payer: Self-pay | Admitting: Orthopaedic Surgery

## 2020-12-24 ENCOUNTER — Ambulatory Visit (INDEPENDENT_AMBULATORY_CARE_PROVIDER_SITE_OTHER): Payer: Medicaid Other | Admitting: Orthopaedic Surgery

## 2020-12-24 DIAGNOSIS — Z96641 Presence of right artificial hip joint: Secondary | ICD-10-CM

## 2020-12-24 MED ORDER — IBUPROFEN 800 MG PO TABS: 800.0000 mg | ORAL_TABLET | Freq: Three times a day (TID) | ORAL | 3 refills | Status: AC | PRN

## 2020-12-24 NOTE — Progress Notes (Signed)
The patient is 2 weeks today status post a right total hip arthroplasty through a direct anterior approach.  He is walking with a cane.  He is with his sister today who is a Engineer, civil (consulting).  He lives in [place].  He says he is doing well overall.  He wants just a prescription for 800 mg Motrin for his pain.  He has been on aspirin as well.  He walks with a cane but walks well overall.  His incision looks good.  The staples have been removed and Steri-Strips applied.  His calf is soft.  He will continue an aspirin a day for 1 week and then can stop his aspirin.  I did send in the 800 mg Motrin to take as needed.  He can drive from my standpoint.  I would like to see him back in 4 weeks to see how he is doing overall but no x-rays are needed.

## 2021-01-21 ENCOUNTER — Ambulatory Visit (INDEPENDENT_AMBULATORY_CARE_PROVIDER_SITE_OTHER): Payer: Self-pay | Admitting: Physician Assistant

## 2021-01-21 ENCOUNTER — Encounter: Payer: Self-pay | Admitting: Physician Assistant

## 2021-01-21 ENCOUNTER — Other Ambulatory Visit: Payer: Self-pay

## 2021-01-21 DIAGNOSIS — Z96641 Presence of right artificial hip joint: Secondary | ICD-10-CM

## 2021-01-21 NOTE — Progress Notes (Signed)
HPI: Bradley Evans returns today now 6 weeks status post right total hip arthroplasty.  He states his right hip overall is doing well.  He is walking nicely carrying a cane.  He states he often forgets the cane.  He is taking no pain medication he is taking occasional ibuprofen and Zanaflex as needed.  He has tenderness about the thigh.  Notes that his incision is well-healed.  He has no concerns other than the thigh tenderness.  Physical exam: Right hip fluid range of motion.  Calf supple nontender.  Dorsiflexion plantarflexion right ankle intact.  Impression: Status post right total hip arthroplasty 12/10/2020  Plan: He will continue to work on range of motion strengthening the hip.  Work on Social research officer, government.  Like to see him back just in 4 weeks no radiographs to make sure the thigh tenderness is resolving.  Questions were encouraged and answered at length.  Patient is no longer on any anticoagulation.

## 2021-02-25 ENCOUNTER — Encounter: Payer: Self-pay | Admitting: Orthopaedic Surgery

## 2021-02-25 ENCOUNTER — Ambulatory Visit (INDEPENDENT_AMBULATORY_CARE_PROVIDER_SITE_OTHER): Payer: Self-pay | Admitting: Orthopaedic Surgery

## 2021-02-25 DIAGNOSIS — Z96641 Presence of right artificial hip joint: Secondary | ICD-10-CM

## 2021-02-25 NOTE — Progress Notes (Signed)
The patient is about 10 weeks status post a right total hip arthroplasty.  He says he is doing better overall.  He reports increased range of motion and increase strength of the right hip.  On examination right hip has still some stiffness with internal and external rotation but overall is not causing him any pain or discomfort.  He is walking without assistive device and appears to have a normal gait.  At this point we will see him back in 6 months.  If there is any issues before then he will let us know.  At that visit I would like a standing low AP pelvis and lateral of his right operative hip.  All questions and concerns were answered and addressed.

## 2021-08-27 ENCOUNTER — Ambulatory Visit (INDEPENDENT_AMBULATORY_CARE_PROVIDER_SITE_OTHER): Payer: Self-pay | Admitting: Orthopaedic Surgery

## 2021-08-27 ENCOUNTER — Ambulatory Visit: Payer: Self-pay

## 2021-08-27 ENCOUNTER — Encounter: Payer: Self-pay | Admitting: Orthopaedic Surgery

## 2021-08-27 DIAGNOSIS — Z96641 Presence of right artificial hip joint: Secondary | ICD-10-CM

## 2021-08-27 NOTE — Progress Notes (Signed)
The patient is now 8 months status post a right total hip arthroplasty.  He says he is doing really well with motion and strength and has no issues with his right hip replacement.  He has been having some pain and he points to his heel on the right side in his Achilles tendon.  He walks about 4 miles a day.  Examination of his right operative hip shows an smoothly and fluidly with no issues at all.  His left hip also moves smoothly.  Examination his right Achilles area shows that he does have pain over the Achilles tendon insertion.  His Thompson test is negative.  An AP pelvis and lateral of the right operative hip shows a well-seated total hip arthroplasty.  He has developed heterotopic bone but clinically this does not seem to be bothering him.  I showed him how to work on Achilles stretching using a Thera-Band.  He is going to also try Voltaren gel twice daily over the Achilles area.  This should improve with time if he is diligent with doing these things.  From a hip standpoint, follow-up can be as needed since he is doing well.  All questions and concerns were answered and addressed.

## 2021-09-05 ENCOUNTER — Telehealth: Payer: Self-pay

## 2021-09-05 NOTE — Telephone Encounter (Signed)
Pt called into the office stating that since his previous apt with Dr. Magnus Ivan his back has been killing him he is in extreme pain and would like to know if he can be seen sooner than the 7th.

## 2021-09-05 NOTE — Telephone Encounter (Signed)
scheduled

## 2021-09-09 ENCOUNTER — Encounter: Payer: Self-pay | Admitting: Orthopaedic Surgery

## 2021-09-09 ENCOUNTER — Ambulatory Visit: Payer: Self-pay

## 2021-09-09 ENCOUNTER — Ambulatory Visit (INDEPENDENT_AMBULATORY_CARE_PROVIDER_SITE_OTHER): Payer: Self-pay | Admitting: Orthopaedic Surgery

## 2021-09-09 ENCOUNTER — Other Ambulatory Visit: Payer: Self-pay

## 2021-09-09 DIAGNOSIS — M545 Low back pain, unspecified: Secondary | ICD-10-CM

## 2021-09-09 MED ORDER — ACETAMINOPHEN-CODEINE #3 300-30 MG PO TABS
1.0000 | ORAL_TABLET | Freq: Three times a day (TID) | ORAL | 0 refills | Status: AC | PRN
Start: 2021-09-09 — End: ?

## 2021-09-09 MED ORDER — BACLOFEN 10 MG PO TABS: 10.0000 mg | ORAL_TABLET | Freq: Three times a day (TID) | ORAL | 1 refills | Status: AC | PRN

## 2021-09-09 MED ORDER — GABAPENTIN 300 MG PO CAPS: 300.0000 mg | ORAL_CAPSULE | Freq: Every day | ORAL | 1 refills | Status: AC

## 2021-09-09 MED ORDER — MELOXICAM 15 MG PO TABS: 15.0000 mg | ORAL_TABLET | Freq: Every day | ORAL | 3 refills | Status: AC

## 2021-09-09 NOTE — Progress Notes (Signed)
The patient comes in today with acute left-sided low back pain that radiates down his left leg in the sciatic region as well.  Is been going on for about 2 weeks now.  We have replaced his right hip before.  He denies any change in bowel or bladder function.  He does appear uncomfortable and denies numbness and tingling.  He is a diabetic as well.  On exam he does have a positive straight leg raise on the left side.  He has a lot of pain in the lower aspect of his lumbar spine into the left side.  He does have a hard time with flexion extension of the lumbar spine and getting up certainly painful.  His left hip exam is normal.  His right total hip is doing well.  2 views of the lumbar spine show degenerative changes at several levels but no acute findings.  I talked him about trying physical therapy for his spine but he has no health insurance and cannot afford to have that done.  Given his diabetes I would not put him on a steroid taper.  I will try medical management with anti-inflammatories, Neurontin, baclofen and some Tylenol No. 3.  We will see what this combination does to help him out and can see him back in a month in follow-up.  If things worsen he knows to let us know.

## 2021-09-15 ENCOUNTER — Ambulatory Visit: Payer: Medicaid Other | Admitting: Orthopaedic Surgery

## 2021-10-08 ENCOUNTER — Ambulatory Visit: Payer: Medicaid Other | Admitting: Orthopaedic Surgery

## 2021-10-20 IMAGING — RF DG HIP (WITH PELVIS) OPERATIVE*R*
1 series · 5 of 5 positions shown · non-contrast
Comparison: Radiographs of the right hip and pelvis 07/10/2020.

CLINICAL DATA: Surgery, elective. Additional history provided:
Right total hip arthroplasty anterior approach. Provided fluoroscopy
time 20 seconds (4.23 mGy).

EXAM:
OPERATIVE right HIP (WITH PELVIS IF PERFORMED) 5 VIEWS
TECHNIQUE: Fluoroscopic spot image(s) were submitted for interpretation
post-operatively.

[Series 1: unknown protocol · 0.20mm/px · 5 of 5 slices shown]
[im 1/5]
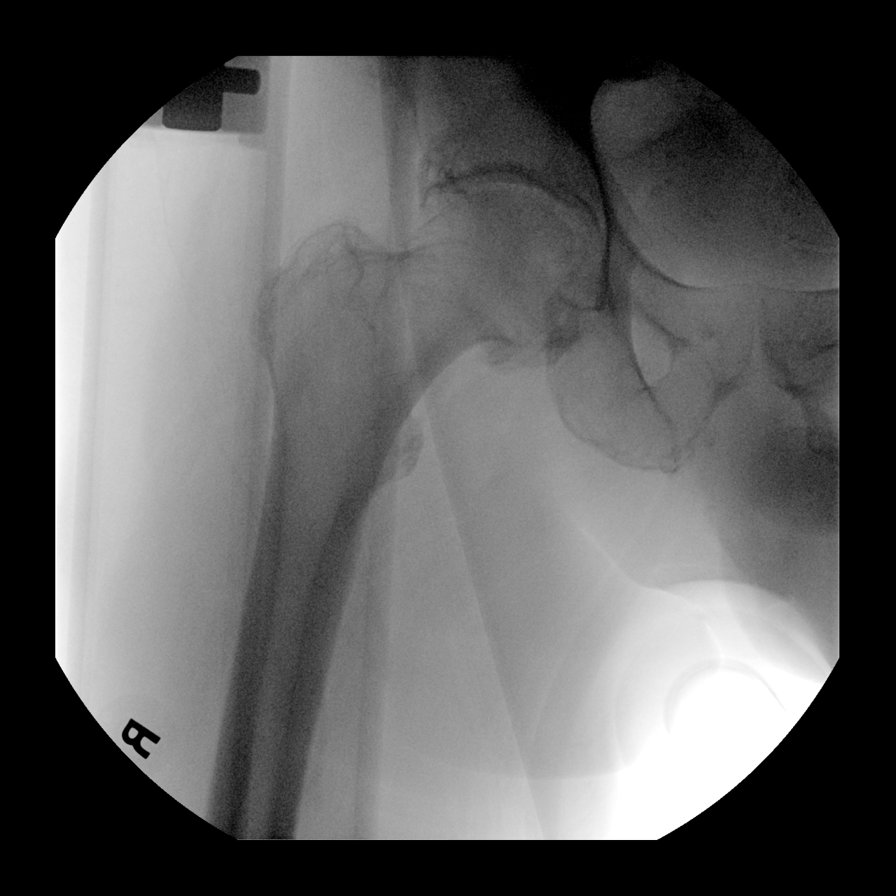
[im 2/5]
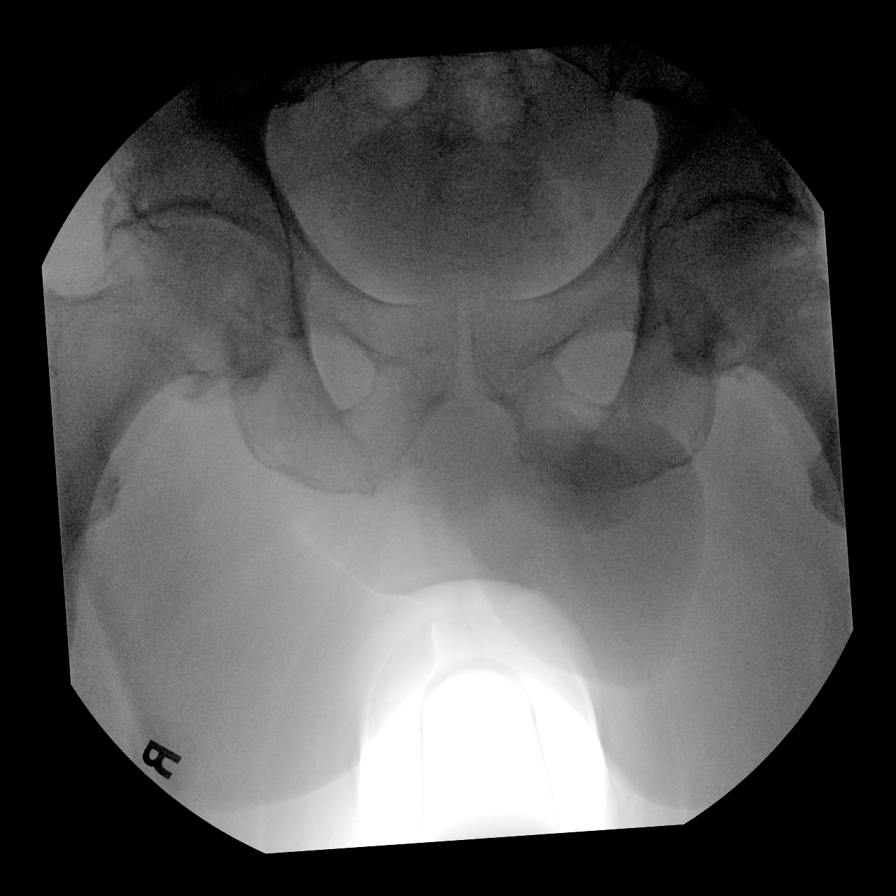
[im 3/5]
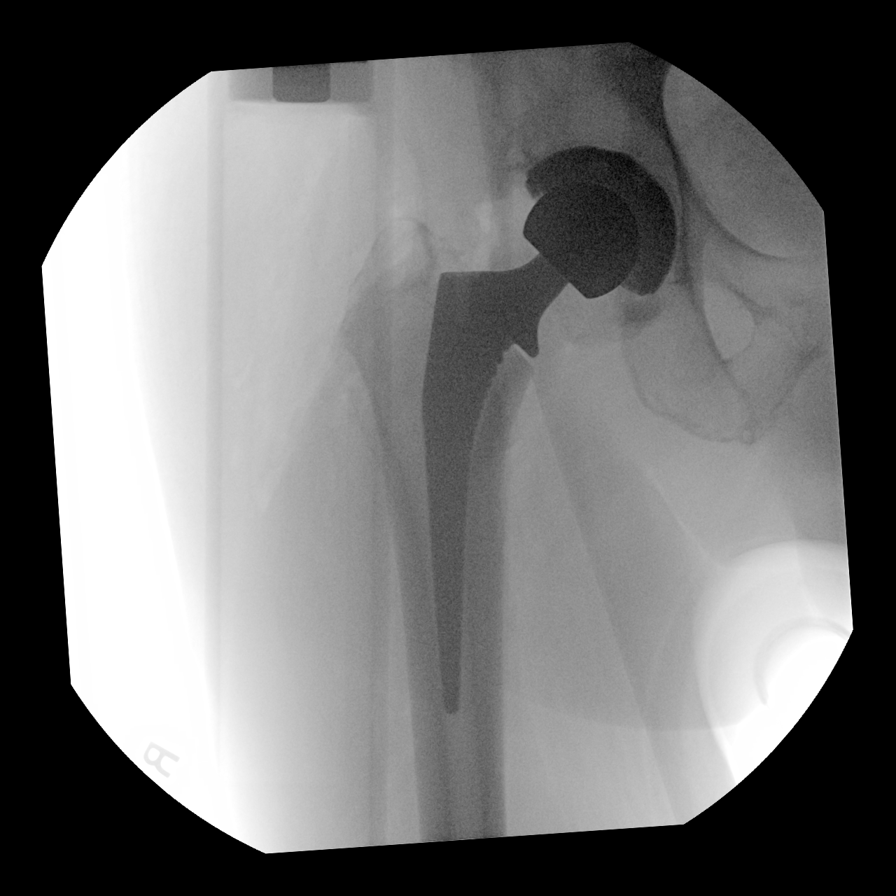
[im 4/5]
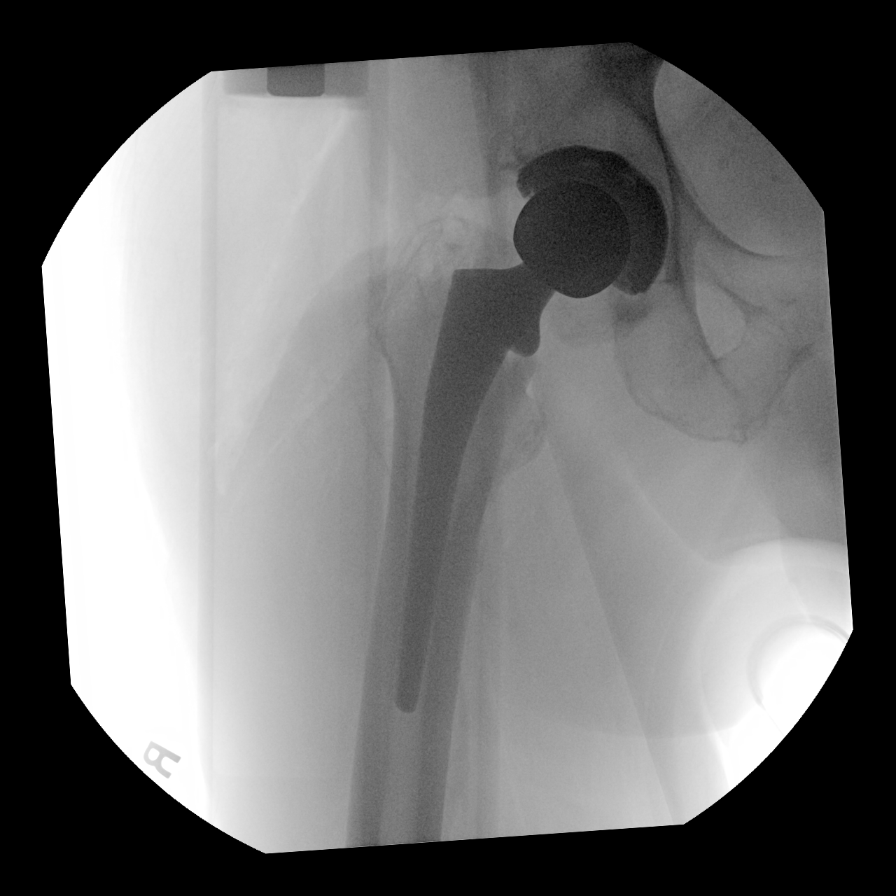
[im 5/5]
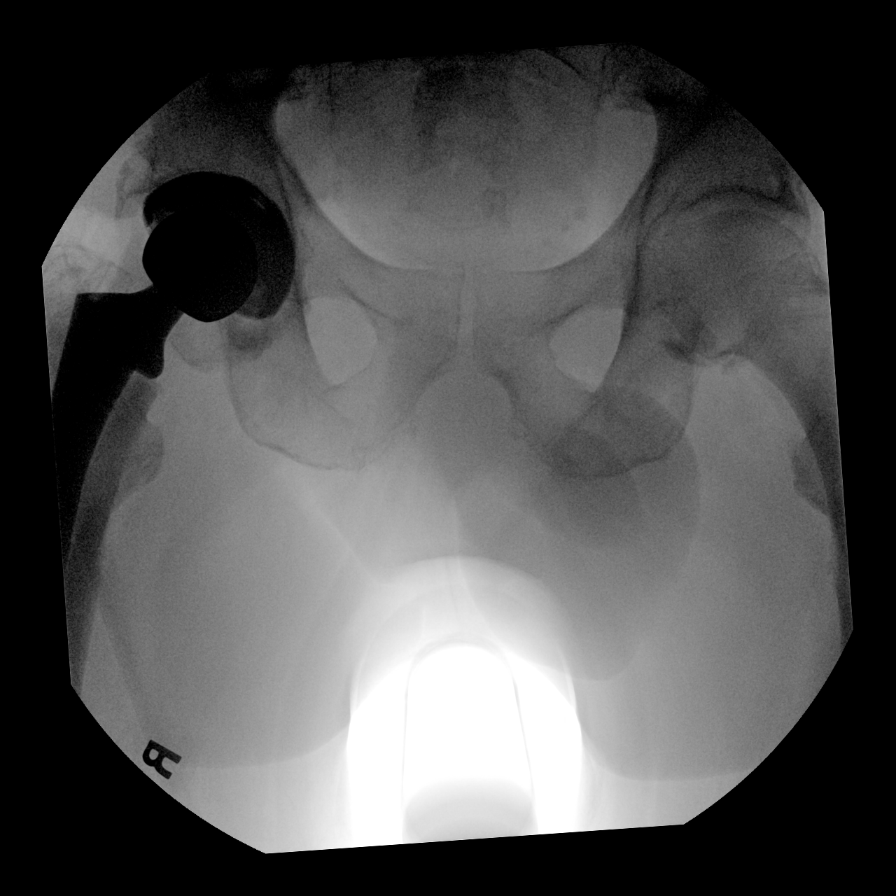

[5 of 5 positions shown; findings below may reference images not displayed]

FINDINGS: Five intraoperative fluoroscopic images of the right hip are
submitted. The images demonstrate sequela of interval right total
hip arthroplasty. The femoral and acetabular components appear
well-seated. No unexpected finding.
IMPRESSION: Five intraoperative fluoroscopic images from right total hip
arthroplasty, as described.
